# Patient Record
Sex: Female | Born: 1950 | Race: White | Hispanic: No | Marital: Married | Smoking: Former smoker
Health system: Southern US, Community
[De-identification: ages and names within clinical notes are randomized; demographics above are authoritative.]

## PROBLEM LIST (undated history)

## (undated) DIAGNOSIS — E119 Type 2 diabetes mellitus without complications: Secondary | ICD-10-CM

## (undated) DIAGNOSIS — IMO0002 Reserved for concepts with insufficient information to code with codable children: Secondary | ICD-10-CM

## (undated) DIAGNOSIS — D649 Anemia, unspecified: Secondary | ICD-10-CM

## (undated) DIAGNOSIS — M329 Systemic lupus erythematosus, unspecified: Secondary | ICD-10-CM

## (undated) DIAGNOSIS — E786 Lipoprotein deficiency: Secondary | ICD-10-CM

## (undated) DIAGNOSIS — C8590 Non-Hodgkin lymphoma, unspecified, unspecified site: Secondary | ICD-10-CM

## (undated) DIAGNOSIS — T8859XA Other complications of anesthesia, initial encounter: Secondary | ICD-10-CM

## (undated) DIAGNOSIS — Z87891 Personal history of nicotine dependence: Secondary | ICD-10-CM

## (undated) DIAGNOSIS — M3500 Sicca syndrome, unspecified: Secondary | ICD-10-CM

## (undated) DIAGNOSIS — F410 Panic disorder [episodic paroxysmal anxiety] without agoraphobia: Secondary | ICD-10-CM

## (undated) DIAGNOSIS — I4891 Unspecified atrial fibrillation: Secondary | ICD-10-CM

## (undated) DIAGNOSIS — I509 Heart failure, unspecified: Secondary | ICD-10-CM

## (undated) DIAGNOSIS — M797 Fibromyalgia: Secondary | ICD-10-CM

## (undated) DIAGNOSIS — K219 Gastro-esophageal reflux disease without esophagitis: Secondary | ICD-10-CM

## (undated) HISTORY — DX: Systemic lupus erythematosus, unspecified: M32.9

## (undated) HISTORY — DX: Anemia, unspecified: D64.9

## (undated) HISTORY — DX: Personal history of nicotine dependence: Z87.891

## (undated) HISTORY — DX: Non-Hodgkin lymphoma, unspecified, unspecified site: C85.90

## (undated) HISTORY — DX: Unspecified atrial fibrillation: I48.91

## (undated) HISTORY — DX: Type 2 diabetes mellitus without complications: E11.9

## (undated) HISTORY — DX: Reserved for concepts with insufficient information to code with codable children: IMO0002

## (undated) HISTORY — DX: Heart failure, unspecified: I50.9

## (undated) HISTORY — PX: LEG SURGERY: SHX1003

## (undated) HISTORY — DX: Lipoprotein deficiency: E78.6

## (undated) HISTORY — DX: Panic disorder (episodic paroxysmal anxiety): F41.0

## (undated) HISTORY — DX: Sjogren syndrome, unspecified: M35.00

## (undated) HISTORY — PX: CHOLECYSTECTOMY: SHX55

## (undated) HISTORY — DX: Gastro-esophageal reflux disease without esophagitis: K21.9

## (undated) HISTORY — PX: TUBAL LIGATION: SHX77

## (undated) HISTORY — DX: Fibromyalgia: M79.7

---

## 1998-05-10 ENCOUNTER — Ambulatory Visit (HOSPITAL_COMMUNITY): Admission: RE | Admit: 1998-05-10 | Discharge: 1998-05-10 | Payer: Self-pay | Admitting: Cardiology

## 1998-11-03 ENCOUNTER — Other Ambulatory Visit: Admission: RE | Admit: 1998-11-03 | Discharge: 1998-11-03 | Payer: Self-pay

## 2013-02-18 ENCOUNTER — Encounter: Payer: Self-pay | Admitting: Physician Assistant

## 2013-02-19 ENCOUNTER — Encounter: Payer: Self-pay | Admitting: Physician Assistant

## 2013-02-19 ENCOUNTER — Encounter: Payer: Self-pay | Admitting: Cardiology

## 2013-02-19 DIAGNOSIS — R42 Dizziness and giddiness: Secondary | ICD-10-CM

## 2013-02-19 DIAGNOSIS — R079 Chest pain, unspecified: Secondary | ICD-10-CM

## 2013-03-09 ENCOUNTER — Encounter: Payer: Self-pay | Admitting: Cardiology

## 2013-03-11 ENCOUNTER — Ambulatory Visit (INDEPENDENT_AMBULATORY_CARE_PROVIDER_SITE_OTHER): Payer: BC Managed Care – PPO | Admitting: Physician Assistant

## 2013-03-11 ENCOUNTER — Encounter: Payer: Self-pay | Admitting: Physician Assistant

## 2013-03-11 VITALS — BP 116/64 | HR 83 | Ht 65.0 in | Wt 145.0 lb

## 2013-03-11 DIAGNOSIS — R079 Chest pain, unspecified: Secondary | ICD-10-CM | POA: Insufficient documentation

## 2013-03-11 DIAGNOSIS — I509 Heart failure, unspecified: Secondary | ICD-10-CM | POA: Insufficient documentation

## 2013-03-11 DIAGNOSIS — E119 Type 2 diabetes mellitus without complications: Secondary | ICD-10-CM | POA: Insufficient documentation

## 2013-03-11 DIAGNOSIS — D649 Anemia, unspecified: Secondary | ICD-10-CM | POA: Insufficient documentation

## 2013-03-11 DIAGNOSIS — Z87891 Personal history of nicotine dependence: Secondary | ICD-10-CM | POA: Insufficient documentation

## 2013-03-11 DIAGNOSIS — Z136 Encounter for screening for cardiovascular disorders: Secondary | ICD-10-CM

## 2013-03-11 MED ORDER — CARVEDILOL 3.125 MG PO TABS: 3.1250 mg | ORAL_TABLET | Freq: Two times a day (BID) | ORAL | Status: DC

## 2013-03-11 MED ORDER — ISOSORBIDE MONONITRATE ER 30 MG PO TB24: 30.0000 mg | ORAL_TABLET | Freq: Every day | ORAL | Status: DC

## 2013-03-11 NOTE — Assessment & Plan Note (Signed)
Will decrease full dose ASA to 81 mg daily. Further workup deferred to primary M.D. team.

## 2013-03-11 NOTE — Assessment & Plan Note (Signed)
Euvolemic by history and exam

## 2013-03-11 NOTE — Patient Instructions (Addendum)
Your physician recommends that you schedule a follow-up appointment in: 2 weeks with Gene Serpe, PA.  Start:  Coreg 3.125mg  twice daily Start:  Imdur 30mg  daily.

## 2013-03-11 NOTE — Assessment & Plan Note (Signed)
We reviewed the results of the recent adequate GXT Cardiolite, deemed a low risk study, as well as the echocardiogram, which indicated mild LVD with WMAs. Although she has no documented CAD, she presents with multiple CRFs, including DM, and history of tobacco. Recommendation is to initiate aggressive medical therapy with addition of beta blocker and long-acting nitrate, and continue to monitor closely. If she continues to experience chest discomfort, which remains worrisome for ischemic heart disease, then I recommend proceeding with a diagnostic coronary angiogram. Patient is agreeable with this plan. Will arrange early clinic followup with me in approximately 2 weeks.

## 2013-03-11 NOTE — Assessment & Plan Note (Signed)
Quit smoking over a year ago.

## 2013-03-11 NOTE — Progress Notes (Signed)
Primary Cardiologist: Simona Huh, MD (new)   HPI: Post hospital followup from [facility], status post recent presentation with UAP. Troponins NL. Patient presented with multiple CRFs, including DM, but no documented history of CAD. Of note, she carried a remote diagnosis of CHF, reportedly by an oncologist whom she was referred to at that time, but for which she was never hospitalized. We recommended a complete noninvasive evaluation while in hospital, with results as follows:   - GXT Cardiolite, June 5 (93% MPHR): Low risk study with no definite scar/ischemia; EF 48%   - Echocardiogram, June 5: EF 40-45%, grade 1 diastolic dysfunction; apical septal HK; basal anteroseptal/basal anterior HK; mild MR; mild PHTN (41 mmHg)  Clinically, she denies any exertional chest discomfort or neck tightness, the latter which she reportedly has experienced for years when climbing uphill. She had some brief, mild CP while sitting and watching television the other night, for which she did not take NTG.   Twelve-lead EKG today, reviewed by me, indicates NSR 83 bpm; isolated PVCs; no acute changes   No Known Allergies  Current Outpatient Prescriptions  Medication Sig Dispense Refill  . ALPRAZolam (XANAX) 0.5 MG tablet Take 0.5 mg by mouth 4 (four) times daily.       Marland Kitchen aspirin 325 MG EC tablet Take 325 mg by mouth daily.      Marland Kitchen CRANBERRY PO Take 1 tablet by mouth daily.      . digoxin (LANOXIN) 0.125 MG tablet Take 0.125 mg by mouth daily.      Marland Kitchen FLUoxetine (PROZAC) 40 MG capsule Take 40 mg by mouth daily.      . hydrOXYzine (ATARAX/VISTARIL) 25 MG tablet Take 25 mg by mouth 3 (three) times daily as needed for itching.      Marland Kitchen ibuprofen (ADVIL,MOTRIN) 200 MG tablet Take 200 mg by mouth 4 (four) times daily as needed for pain.      Marland Kitchen lansoprazole (PREVACID) 15 MG capsule Take 15 mg by mouth daily.      Marland Kitchen lisinopril (PRINIVIL,ZESTRIL) 2.5 MG tablet Take 2.5 mg by mouth daily.      . metFORMIN (GLUCOPHAGE) 500 MG  tablet Take 1,000-2,000 mg by mouth as needed.      . Multiple Vitamin (MULTIVITAMIN) tablet Take 1 tablet by mouth daily.      . nitroGLYCERIN (NITROSTAT) 0.4 MG SL tablet Place 0.4 mg under the tongue every 5 (five) minutes as needed for chest pain.      . predniSONE (DELTASONE) 5 MG tablet Take 5 mg by mouth daily.      . carvedilol (COREG) 3.125 MG tablet Take 1 tablet (3.125 mg total) by mouth 2 (two) times daily with a meal.  180 tablet  3  . isosorbide mononitrate (IMDUR) 30 MG 24 hr tablet Take 1 tablet (30 mg total) by mouth daily.  90 tablet  3   No current facility-administered medications for this visit.    Past Medical History  Diagnosis Date  . DM (diabetes mellitus)   . Low HDL (under 40)   . GERD (gastroesophageal reflux disease)   . History of tobacco use   . Sjogren's disease   . Fibromyalgia   . Panic disorder   . CHF (congestive heart failure)     Reported remote negative pharmacologic nuclear imaging study and echocardiogram  . Anemia     No past surgical history on file.  History   Social History  . Marital Status: Married    Spouse Name: N/A  Number of Children: N/A  . Years of Education: N/A   Occupational History  . Not on file.   Social History Main Topics  . Smoking status: Former Smoker    Types: Cigarettes    Start date: 09/17/1964    Quit date: 09/18/2011  . Smokeless tobacco: Not on file  . Alcohol Use: Not on file  . Drug Use: Not on file  . Sexually Active: Not on file   Other Topics Concern  . Not on file   Social History Narrative  . No narrative on file   Social History Narrative  . No narrative on file    Problem Relation Age of Onset  . Heart failure Mother   . Heart disease Father     CABG in 70'S    ROS: no nausea, vomiting; no fever, chills; no melena, hematochezia; no claudication  PHYSICAL EXAM: BP 116/64  Pulse 83  Ht 5\' 5"  (1.651 m)  Wt 145 lb (65.772 kg)  BMI 24.13 kg/m2 GENERAL: 62 year old female;  NAD HEENT: NCAT, PERRLA, EOMI; sclera clear; no xanthelasma NECK: palpable bilateral carotid pulses, no bruits; no JVD; no TM LUNGS: CTA bilaterally CARDIAC: RRR (S1, S2); no significant murmurs; no rubs or gallops ABDOMEN: soft, non-tender; intact BS EXTREMETIES: no significant peripheral edema SKIN: warm/dry; no obvious rash/lesions MUSCULOSKELETAL: no joint deformity NEURO: no focal deficit; NL affect   EKG: reviewed and available in Electronic Records   ASSESSMENT & PLAN:  Chest pain We reviewed the results of the recent adequate GXT Cardiolite, deemed a low risk study, as well as the echocardiogram, which indicated mild LVD with WMAs. Although she has no documented CAD, she presents with multiple CRFs, including DM, and history of tobacco. Recommendation is to initiate aggressive medical therapy with addition of beta blocker and long-acting nitrate, and continue to monitor closely. If she continues to experience chest discomfort, which remains worrisome for ischemic heart disease, then I recommend proceeding with a diagnostic coronary angiogram. Patient is agreeable with this plan. Will arrange early clinic followup with me in approximately 2 weeks.  History of tobacco use Quit smoking over a year ago  DM (diabetes mellitus) Followed by primary M.D.  CHF (congestive heart failure) Euvolemic by history and exam  Anemia Will decrease full dose ASA to 81 mg daily. Further workup deferred to primary M.D. team.    Rozell Searing, [facility]

## 2013-03-11 NOTE — Assessment & Plan Note (Signed)
Followed by primary M.D. 

## 2013-03-26 ENCOUNTER — Ambulatory Visit (INDEPENDENT_AMBULATORY_CARE_PROVIDER_SITE_OTHER): Payer: BC Managed Care – PPO | Admitting: Physician Assistant

## 2013-03-26 ENCOUNTER — Encounter: Payer: Self-pay | Admitting: Physician Assistant

## 2013-03-26 VITALS — BP 103/62 | HR 76 | Ht 65.5 in | Wt 145.8 lb

## 2013-03-26 DIAGNOSIS — E785 Hyperlipidemia, unspecified: Secondary | ICD-10-CM | POA: Insufficient documentation

## 2013-03-26 DIAGNOSIS — R079 Chest pain, unspecified: Secondary | ICD-10-CM

## 2013-03-26 DIAGNOSIS — E119 Type 2 diabetes mellitus without complications: Secondary | ICD-10-CM

## 2013-03-26 DIAGNOSIS — D649 Anemia, unspecified: Secondary | ICD-10-CM

## 2013-03-26 MED ORDER — SIMVASTATIN 20 MG PO TABS: 20.0000 mg | ORAL_TABLET | Freq: Every evening | ORAL | Status: DC

## 2013-03-26 NOTE — Assessment & Plan Note (Signed)
Patient remains euvolemic by history and exam. No current indication to add a diuretic to her regimen. I did, however, advise her to not add salt in her diet, and to monitor her weight on a near daily basis. I will also discontinue digoxin, which she was placed on approximately 15 years ago. Preference would be to treat her mild cardiomyopathy with beta blocker and ACE inhibitor therapy. Of note, I am currently unable to up titrate either her carvedilol or her lisinopril dose, secondary to relative hypotension.

## 2013-03-26 NOTE — Assessment & Plan Note (Signed)
Followed by primary M.D. As outlined above, patient to be started on statin therapy today.

## 2013-03-26 NOTE — Assessment & Plan Note (Signed)
Will initiate low intensity statin therapy for plaque stabilization, given that patient has DM. Recent lipid profile notable for LDL 74, but with HDL 23. Will reassess lipid status in 12 weeks.

## 2013-03-26 NOTE — Patient Instructions (Addendum)
Stop Digoxin  Legs elevated  No added salt   Weigh daily  Begin Zocor 20mg  every evening   Labs for FLP, LFT - due in 12 weeks - will send reminder in mail  Continue all other current medications. Follow up in  2 months    Daily Weight Record It is important to weigh yourself daily. Keep this daily weight chart near your scale. Weigh yourself each morning at the same time. Weigh yourself without shoes and with the same amount of clothes each day. Compare today's weight to yesterday's weight. Bring this form with you to your follow-up appointments. Call your caregiver if you gain 3 lb/1.4 kg in 1 day. Call your caregiver if you gain 5 lb/2.3 kg in a week. Date: ________ Weight: ____________________ Date: ________ Weight: ____________________ Date: ________ Weight: ____________________ Date: ________ Weight: ____________________ Date: ________ Weight: ____________________ Date: ________ Weight: ____________________ Date: ________ Weight: ____________________ Date: ________ Weight: ____________________ Date: ________ Weight: ____________________ Date: ________ Weight: ____________________ Date: ________ Weight: ____________________ Date: ________ Weight: ____________________ Date: ________ Weight: ____________________ Date: ________ Weight: ____________________ Date: ________ Weight: ____________________ Date: ________ Weight: ____________________ Date: ________ Weight: ____________________ Date: ________ Weight: ____________________ Date: ________ Weight: ____________________ Date: ________ Weight: ____________________ Date: ________ Weight: ____________________ Date: ________ Weight: ____________________ Date: ________ Weight: ____________________ Date: ________ Weight: ____________________ Date: ________ Weight: ____________________ Date: ________ Weight: ____________________ Date: ________ Weight: ____________________ Date: ________ Weight: ____________________ Date: ________  Weight: ____________________ Date: ________ Weight: ____________________ Date: ________ Weight: ____________________ Date: ________ Weight: ____________________ Date: ________ Weight: ____________________ Date: ________ Weight: ____________________ Date: ________ Weight: ____________________ Date: ________ Weight: ____________________ Date: ________ Weight: ____________________ Date: ________ Weight: ____________________ Date: ________ Weight: ____________________ Date: ________ Weight: ____________________ Date: ________ Weight: ____________________ Date: ________ Weight: ____________________ Date: ________ Weight: ____________________ Date: ________ Weight: ____________________ Date: ________ Weight: ____________________ Date: ________ Weight: ____________________ Date: ________ Weight: ____________________ Date: ________ Weight: ____________________ Date: ________ Weight: ____________________ Date: ________ Weight: ____________________ Document Released: 11/15/2006 Document Revised: 11/26/2011 Document Reviewed: 08/22/2007 [employer] Patient Information 2014 [employer]   Sodium-Controlled Diet Sodium is a mineral. It is found in many foods. Sodium may be found naturally or added during the making of a food. The most common form of sodium is salt, which is made up of sodium and chloride. Reducing your sodium intake involves changing your eating habits. The following guidelines will help you reduce the sodium in your diet:  Stop using the salt shaker.  Use salt sparingly in cooking and baking.  Substitute with sodium-free seasonings and spices.  Do not use a salt substitute (potassium chloride) without your caregiver's permission.  Include a variety of fresh, unprocessed foods in your diet.  Limit the use of processed and convenience foods that are high in sodium. USE THE FOLLOWING FOODS SPARINGLY: Breads/Starches  Commercial bread stuffing, commercial pancake or waffle mixes,  coating mixes. Waffles. Croutons. Prepared (boxed or frozen) potato, rice, or noodle mixes that contain salt or sodium. Salted [place] fries or hash browns. Salted popcorn, breads, crackers, chips, or snack foods. Vegetables  Vegetables canned with salt or prepared in cream, butter, or cheese sauces. Sauerkraut. Tomato or vegetable juices canned with salt.  Fresh vegetables are allowed if rinsed thoroughly. Fruit  Fruit is okay to eat. Meat and Meat Substitutes  Salted or smoked meats, such as bacon or Canadian bacon, chipped or corned beef, hot dogs, salt pork, luncheon meats, pastrami, ham, or sausage. Canned or smoked fish, poultry, or meat. Processed cheese or cheese spreads, blue or [place]ef[place] cheese. Battered  or frozen fish products. Prepared spaghetti sauce. Baked beans. Reuben sandwiches. Salted nuts. Caviar. Milk  Limit buttermilk to 1 cup per week. Soups and Combination Foods  Bouillon cubes, canned or dried soups, broth, consomm. Convenience (frozen or packaged) dinners with more than 600 mg sodium. Pot pies, pizza, Asian food, fast food cheeseburgers, and specialty sandwiches. Desserts and Sweets  Regular (salted) desserts, pie, commercial fruit snack pies, commercial snack cakes, canned puddings.  Eat desserts and sweets in moderation. Fats and Oils  Gravy mixes or canned gravy. No more than 1 to 2 tbs of salad dressing. Chip dips.  Eat fats and oils in moderation. Beverages  See those listed under the vegetables and milk groups. Condiments  Ketchup, mustard, meat sauces, salsa, regular (salted) and lite soy sauce or mustard. Dill pickles, olives, meat tenderizer. Prepared horseradish or pickle relish. Dutch-processed cocoa. Baking powder or baking soda used medicinally. Worcestershire sauce. "Light" salt. Salt substitute, unless approved by your caregiver. Document Released: 02/23/2002 Document Revised: 11/26/2011 Document Reviewed: 09/26/2009 [facility] Patient  Information 2014 [place], [place].

## 2013-03-26 NOTE — Assessment & Plan Note (Signed)
Followed by primary M.D. As previously advised, patient to remain on low-dose ASA 81 mg daily, for primary prevention.

## 2013-03-26 NOTE — Progress Notes (Signed)
Primary Cardiologist: Simona Huh, MD   HPI: Scheduled 2 week followup.  At time of recent post hospital followup, I recommended starting carvedilol 3.125 twice a day and Imdur 30 mg daily, for aggressive medical management of possible ischemic chest pain. Patient had ruled out for MI with NL troponins, and had an adequate GXT Cardiolite which was negative for definite ischemia. She presented with multiple CRFs, including DM, but no documented history of CAD.  She returns today reporting no interim CP, either with exertion or at rest. She is tolerating her current medication regimen. She denies symptoms suggestive of CHF.  No Known Allergies  Current Outpatient Prescriptions  Medication Sig Dispense Refill  . ALPRAZolam (XANAX) 0.5 MG tablet Take 0.5 mg by mouth 4 (four) times daily.       Marland Kitchen aspirin 325 MG EC tablet Take 162.5 mg by mouth daily.       . Calcium Carb-Cholecalciferol (CALCIUM + D3 PO) Take 1 tablet by mouth daily.      . carvedilol (COREG) 3.125 MG tablet Take 1 tablet (3.125 mg total) by mouth 2 (two) times daily with a meal.  180 tablet  3  . CRANBERRY PO Take 1 tablet by mouth daily.      Marland Kitchen FLUoxetine (PROZAC) 40 MG capsule Take 40 mg by mouth daily.      . hydrOXYzine (ATARAX/VISTARIL) 25 MG tablet Take 25 mg by mouth 3 (three) times daily as needed for itching.      Marland Kitchen ibuprofen (ADVIL,MOTRIN) 200 MG tablet Take 200 mg by mouth 4 (four) times daily as needed for pain.      . isosorbide mononitrate (IMDUR) 30 MG 24 hr tablet Take 1 tablet (30 mg total) by mouth daily.  90 tablet  3  . lansoprazole (PREVACID) 15 MG capsule Take 15 mg by mouth daily.      Marland Kitchen lisinopril (PRINIVIL,ZESTRIL) 2.5 MG tablet Take 5 mg by mouth daily.       . Melatonin 3 MG TABS Take 3 mg by mouth at bedtime.      . metFORMIN (GLUCOPHAGE) 500 MG tablet Take 1,000-2,000 mg by mouth as needed.      . Multiple Vitamin (MULTIVITAMIN) tablet Take 1 tablet by mouth daily.      . nitroGLYCERIN (NITROSTAT)  0.4 MG SL tablet Place 0.4 mg under the tongue every 5 (five) minutes as needed for chest pain.      . predniSONE (DELTASONE) 5 MG tablet Take 5 mg by mouth daily.      . simvastatin (ZOCOR) 20 MG tablet Take 1 tablet (20 mg total) by mouth every evening.  30 tablet  6   No current facility-administered medications for this visit.    Past Medical History  Diagnosis Date  . DM (diabetes mellitus)   . Low HDL (under 40)   . GERD (gastroesophageal reflux disease)   . History of tobacco use   . Sjogren's disease   . Fibromyalgia   . Panic disorder   . CHF (congestive heart failure)     Reported remote negative pharmacologic nuclear imaging study and echocardiogram  . Anemia     No past surgical history on file.  History   Social History  . Marital Status: Married    Spouse Name: N/A    Number of Children: N/A  . Years of Education: N/A   Occupational History  . Not on file.   Social History Main Topics  . Smoking status: Former Smoker  Types: Cigarettes    Start date: 09/17/1964    Quit date: 09/18/2011  . Smokeless tobacco: Not on file  . Alcohol Use: Not on file  . Drug Use: Not on file  . Sexually Active: Not on file   Other Topics Concern  . Not on file   Social History Narrative  . No narrative on file    Family History  Problem Relation Age of Onset  . Heart failure Mother   . Heart disease Father     CABG in 70'S    ROS: no nausea, vomiting; no fever, chills; no melena, hematochezia; no claudication  PHYSICAL EXAM: BP 103/62  Pulse 76  Ht 5' 5.5" (1.664 m)  Wt 145 lb 12.8 oz (66.134 kg)  BMI 23.88 kg/m2 GENERAL: 62 year old female; NAD HEENT: NCAT, PERRLA, EOMI; sclera clear; no xanthelasma NECK: palpable bilateral carotid pulses, no bruits; no JVD; no TM LUNGS: CTA bilaterally CARDIAC: RRR (S1, S2); no significant murmurs; no rubs or gallops ABDOMEN: soft, non-tender; intact BS EXTREMETIES:  trace peripheral edema SKIN: warm/dry; no  obvious rash/lesions MUSCULOSKELETAL: no joint deformity NEURO: no focal deficit; NL affect   EKG:    ASSESSMENT & PLAN:  Chest pain No further workup currently indicated. Patient has agreed that we are to maintain a low threshold for diagnostic coronary angiography, if she were to develop any recurrent CP. In retrospect, her initial presenting symptoms appear to have been isolated. Nevertheless, she has multiple CRFs, including DM, and had evidence of WMAs on echocardiography. Will continue current medication regimen, and initiate statin therapy.  HLD (hyperlipidemia) Will initiate low intensity statin therapy for plaque stabilization, given that patient has DM. Recent lipid profile notable for LDL 74, but with HDL 23. Will reassess lipid status in 12 weeks.  CHF (congestive heart failure) Patient remains euvolemic by history and exam. No current indication to add a diuretic to her regimen. I did, however, advise her to not add salt in her diet, and to monitor her weight on a near daily basis. I will also discontinue digoxin, which she was placed on approximately 15 years ago. Preference would be to treat her mild cardiomyopathy with beta blocker and ACE inhibitor therapy. Of note, I am currently unable to up titrate either her carvedilol or her lisinopril dose, secondary to relative hypotension.  DM (diabetes mellitus) Followed by primary M.D. As outlined above, patient to be started on statin therapy today.   Anemia Followed by primary M.D. As previously advised, patient to remain on low-dose ASA 81 mg daily, for primary prevention.    Gene Adja Ruff, PAC

## 2013-03-26 NOTE — Assessment & Plan Note (Signed)
No further workup currently indicated. Patient has agreed that we are to maintain a low threshold for diagnostic coronary angiography, if she were to develop any recurrent CP. In retrospect, her initial presenting symptoms appear to have been isolated. Nevertheless, she has multiple CRFs, including DM, and had evidence of WMAs on echocardiography. Will continue current medication regimen, and initiate statin therapy.

## 2013-04-08 ENCOUNTER — Telehealth: Payer: Self-pay | Admitting: *Deleted

## 2013-04-08 NOTE — Telephone Encounter (Signed)
pt called wanting to know when she is suppose to come in for her Labs. informed pt that notes on file states she will be mailed a reminder in the mail about her labs. pt agreed. Sent staff message Rollen Sox nurse to inform her of the call as well.

## 2013-04-14 ENCOUNTER — Telehealth: Payer: Self-pay | Admitting: *Deleted

## 2013-04-14 NOTE — Telephone Encounter (Signed)
Left message to return call 

## 2013-04-14 NOTE — Telephone Encounter (Signed)
Message copied by Lesle Chris on Tue Apr 14, 2013  2:08 PM ------      Message from: Andrey Cota A      Created: Wed Apr 08, 2013 10:45 AM      Regarding: FYI       Pt called wanting to know when her Labs was suppose to be done. Informed pt that notes states she will be mailed her orders to get labs done. I didn't know if you had a reminder for this or not.  ------

## 2013-04-16 ENCOUNTER — Telehealth: Payer: Self-pay | Admitting: Cardiology

## 2013-04-16 NOTE — Telephone Encounter (Signed)
Informed pt that lab orders will be mailed to her around the end of September first of October

## 2013-04-20 NOTE — Telephone Encounter (Signed)
Burnadette Pop, CMA notified patient last week.

## 2013-05-20 ENCOUNTER — Ambulatory Visit (INDEPENDENT_AMBULATORY_CARE_PROVIDER_SITE_OTHER): Payer: BC Managed Care – PPO | Admitting: Cardiology

## 2013-05-20 ENCOUNTER — Ambulatory Visit: Payer: BC Managed Care – PPO | Admitting: Physician Assistant

## 2013-05-20 ENCOUNTER — Encounter: Payer: Self-pay | Admitting: Cardiology

## 2013-05-20 VITALS — BP 118/59 | HR 89 | Ht 65.5 in | Wt 143.8 lb

## 2013-05-20 DIAGNOSIS — I5022 Chronic systolic (congestive) heart failure: Secondary | ICD-10-CM

## 2013-05-20 DIAGNOSIS — R079 Chest pain, unspecified: Secondary | ICD-10-CM

## 2013-05-20 MED ORDER — CARVEDILOL 6.25 MG PO TABS: 6.2500 mg | ORAL_TABLET | Freq: Two times a day (BID) | ORAL | Status: DC

## 2013-05-20 NOTE — Progress Notes (Signed)
Clinical Summary Ms. Towles is a 62 y.o.female  1. Chest pain:  - prior admission for chest pain with negative troponins, negative exercise stress MPI - seen in f/u 03/26/2013 by PA Prescott Parma w/ no reported repeat episodes of chest pain.  - denies any recent chest pain, describes some DOE w/ grocery shopping, vaccuuming which is stable x 2-3 years - she takes ASA 162 mg daily for primary prevention, discussed previously cutting down to 81 mg but she recently purchased several 325mg  and now is cutting in half. She was started on a statin last visit secondary to her history of DM.   2. Systolic dysfunction: - denies any orthopnea, no PND, occas mild LE edema - compliant w/ medications, limiting sodium intake -checking weights daily, stable around 140 lbs - She takes ibuprofen prn for night time leg pain, denies any claudication like symptoms.    No Known Allergies  Current Outpatient Prescriptions  Medication Sig Dispense Refill  . ALPRAZolam (XANAX) 0.5 MG tablet Take 0.5 mg by mouth 4 (four) times daily.       Marland Kitchen aspirin 325 MG EC tablet Take 162.5 mg by mouth daily.       . Calcium Carb-Cholecalciferol (CALCIUM + D3 PO) Take 1 tablet by mouth daily.      . carvedilol (COREG) 3.125 MG tablet Take 1 tablet (3.125 mg total) by mouth 2 (two) times daily with a meal.  180 tablet  3  . CRANBERRY PO Take 1 tablet by mouth daily.      Marland Kitchen FLUoxetine (PROZAC) 40 MG capsule Take 40 mg by mouth daily.      . hydrOXYzine (ATARAX/VISTARIL) 25 MG tablet Take 25 mg by mouth 3 (three) times daily as needed for itching.      Marland Kitchen ibuprofen (ADVIL,MOTRIN) 200 MG tablet Take 200 mg by mouth 4 (four) times daily as needed for pain.      . isosorbide mononitrate (IMDUR) 30 MG 24 hr tablet Take 1 tablet (30 mg total) by mouth daily.  90 tablet  3  . lansoprazole (PREVACID) 15 MG capsule Take 15 mg by mouth daily.      Marland Kitchen lisinopril (PRINIVIL,ZESTRIL) 2.5 MG tablet Take 5 mg by mouth daily.       .  Melatonin 3 MG TABS Take 3 mg by mouth at bedtime.      . metFORMIN (GLUCOPHAGE) 500 MG tablet Take 1,000-2,000 mg by mouth as needed.      . Multiple Vitamin (MULTIVITAMIN) tablet Take 1 tablet by mouth daily.      . nitroGLYCERIN (NITROSTAT) 0.4 MG SL tablet Place 0.4 mg under the tongue every 5 (five) minutes as needed for chest pain.      . predniSONE (DELTASONE) 5 MG tablet Take 5 mg by mouth daily.      . simvastatin (ZOCOR) 20 MG tablet Take 1 tablet (20 mg total) by mouth every evening.  30 tablet  6   No current facility-administered medications for this visit.    Past Medical History  Diagnosis Date  . DM (diabetes mellitus)   . Low HDL (under 40)   . GERD (gastroesophageal reflux disease)   . History of tobacco use   . Sjogren's disease   . Fibromyalgia   . Panic disorder   . CHF (congestive heart failure)     Reported remote negative pharmacologic nuclear imaging study and echocardiogram  . Anemia     No past surgical history on file.  Family History  Problem Relation Age of Onset  . Heart failure Mother   . Heart disease Father     CABG in 39'S    Social History Ms. Poffenberger reports that she quit smoking about 20 months ago. Her smoking use included Cigarettes. She started smoking about 48 years ago. She smoked 0.00 packs per day. She does not have any smokeless tobacco history on file. Ms. Dixson has no alcohol history on file.  Review of Systems Negative other than HPI   Physical Examination p 89 bp 118/59 Gen: NAD CV: RRR, no m/r/g, no JVD, no carotid bruits, 2+ DP/PT pulses bilaterally Pulm: CTAB Abd: soft, NT, ND, no hepatosplenomegaly Ext: warm, no LE edema Neuro: A&Ox3, no focal deficits Skin: intact, warm, no rash   Diagnostic Studies  02/2013 Exercise MPI: exc 3 min, 7 METs, 93% THR, LVEF 48%, small fixed apical defect due to breast attenuation.   02/2013 Echo: LVEF 40-45%, Grade I diastolic dysfunction, hypokinetic apical septal wall.  Akinetic basal anterolateral and basal anterior walls. Mild MR  EKG 03/11/13: Sinus rhythm, normal axis, no chamber enlargement, no ischemic changes, normal R wave progression.   Assessment/Plan 1. Chest pain: no recurrent episodes since prior admission. Negative MPI, echo w/ mildly reduced LV systolic function w/ described wall motion abnormalities. Continue current CAD risk factor modification and prevention. Consider stopping imdur at next visit, started previously in the setting of suspected angina.    2. Systolic dysfunction/Systolic heart failure: mild LV systolic dysfunction on last echo, WMAs described on echo but negative MPI.Describes NYHA II-III symptoms. Will titrate heart failure medications gradually  to goal doses over next few visits, will increase coreg to 6.25mg  bid today. She has been counseled to monitor for side effects. Also counseled to refrain from taking ibuprofen at home in the setting of systolic dysfunction. F/u in 1 month for further medication titration, once optimized on beta blocker and ACE-I consider adding spironolactone.     Dina Rich MD.

## 2013-05-20 NOTE — Patient Instructions (Addendum)
Your physician recommends that you schedule a follow-up appointment in: 1 month with Dr. Wyline Mood. This appointment will be made today before you leave.  Your physician has recommended you make the following change in your medication:  Increase Carevidol to 6.25 mg twice daily.   Continue all other medications the same.

## 2013-07-01 ENCOUNTER — Ambulatory Visit: Payer: BC Managed Care – PPO | Admitting: Cardiology

## 2013-07-01 ENCOUNTER — Telehealth: Payer: Self-pay | Admitting: *Deleted

## 2013-07-01 ENCOUNTER — Encounter: Payer: Self-pay | Admitting: *Deleted

## 2013-07-01 DIAGNOSIS — Z87891 Personal history of nicotine dependence: Secondary | ICD-10-CM

## 2013-07-01 DIAGNOSIS — Z79899 Other long term (current) drug therapy: Secondary | ICD-10-CM

## 2013-07-01 DIAGNOSIS — E785 Hyperlipidemia, unspecified: Secondary | ICD-10-CM

## 2013-07-01 NOTE — Telephone Encounter (Signed)
Letter & orders mailed today.  

## 2013-07-01 NOTE — Telephone Encounter (Signed)
Message copied by Lesle Chris on Wed Jul 01, 2013 11:08 AM ------      Message from: Lesle Chris      Created: Thu Mar 26, 2013 12:02 PM       FLP, LFT  ------

## 2013-07-23 ENCOUNTER — Telehealth: Payer: Self-pay | Admitting: Cardiology

## 2013-07-23 ENCOUNTER — Other Ambulatory Visit: Payer: Self-pay

## 2013-07-23 NOTE — Telephone Encounter (Signed)
Called pt and left message for pt to return my call.

## 2013-07-24 NOTE — Telephone Encounter (Signed)
Patient called back and informed me that she has not had lab work completed yet.

## 2013-08-21 ENCOUNTER — Telehealth: Payer: Self-pay | Admitting: Cardiology

## 2013-08-21 ENCOUNTER — Encounter: Payer: Self-pay | Admitting: Cardiology

## 2013-08-21 NOTE — Telephone Encounter (Signed)
Called pt and asked if lab work had been completed yet. PT stated that she could not find the lab order and that she had not had them done yet. I informed pt that I would send her a letter with the order. She plans to have lab work done at [facility].

## 2013-08-21 NOTE — Telephone Encounter (Signed)
Message copied by Burnice Logan on Fri Aug 21, 2013  9:26 AM ------      Message from: Lesle Chris      Created: Wed Jul 01, 2013 11:40 AM      Regarding: [facility] FU        Mailed orders for FLP/LFT today.  (per gene OV in July)            Patient is following [facility] now.             Thx.  ------

## 2013-09-09 ENCOUNTER — Other Ambulatory Visit: Payer: Self-pay | Admitting: Physician Assistant

## 2013-09-11 ENCOUNTER — Telehealth: Payer: Self-pay | Admitting: Cardiology

## 2013-09-11 NOTE — Telephone Encounter (Signed)
Message copied by Burnice Logan on Fri Sep 11, 2013 11:47 AM ------      Message from: Dina Rich F      Created: Fri Sep 11, 2013 11:07 AM       Please let patient know that labs look good.            Dina Rich MD ------

## 2013-09-11 NOTE — Telephone Encounter (Signed)
Pt informed of results and verbalized understanding.  

## 2013-09-15 ENCOUNTER — Telehealth: Payer: Self-pay | Admitting: Cardiology

## 2013-09-15 NOTE — Telephone Encounter (Signed)
I will place a recall to send out in a month if she hasn't called to resch/nta

## 2013-09-21 ENCOUNTER — Other Ambulatory Visit: Payer: Self-pay | Admitting: Cardiology

## 2013-09-21 MED ORDER — CARVEDILOL 6.25 MG PO TABS: 6.2500 mg | ORAL_TABLET | Freq: Two times a day (BID) | ORAL | Status: DC

## 2013-10-23 ENCOUNTER — Ambulatory Visit (INDEPENDENT_AMBULATORY_CARE_PROVIDER_SITE_OTHER): Payer: BC Managed Care – PPO | Admitting: Cardiology

## 2013-10-23 VITALS — BP 99/62 | HR 73 | Ht 65.0 in | Wt 150.4 lb

## 2013-10-23 DIAGNOSIS — I5022 Chronic systolic (congestive) heart failure: Secondary | ICD-10-CM

## 2013-10-23 MED ORDER — FUROSEMIDE 20 MG PO TABS
20.0000 mg | ORAL_TABLET | ORAL | Status: DC | PRN
Start: 2013-10-23 — End: 2014-01-21

## 2013-10-23 NOTE — Progress Notes (Signed)
Clinical Summary Kristin Crosby is a 63 y.o.female seen today for follow up of the following medical problems.  1. Chronic Systolic dysfunction:  - LVEF 40-45% by echo 02/2013, she is NYHA II.  - previous negative exercise stress MPI for ischemia  - last visit increased coreg to 6.25mg  bid. Denies any significant side effects.  - notes some orthopnea over the last few weeks. DOE at 3-4 blocks which is stable. Denies any chest pain    Past Medical History  Diagnosis Date  . DM (diabetes mellitus)   . Low HDL (under 40)   . GERD (gastroesophageal reflux disease)   . History of tobacco use   . Sjogren's disease   . Fibromyalgia   . Panic disorder   . CHF (congestive heart failure)     Reported remote negative pharmacologic nuclear imaging study and echocardiogram  . Anemia      No Known Allergies   Current Outpatient Prescriptions  Medication Sig Dispense Refill  . ALPRAZolam (XANAX) 0.5 MG tablet Take 0.5 mg by mouth 4 (four) times daily.       Marland Kitchen aspirin 325 MG EC tablet Take 162.5 mg by mouth daily.       . Calcium Carb-Cholecalciferol (CALCIUM + D3 PO) Take 1 tablet by mouth daily.      . carvedilol (COREG) 6.25 MG tablet Take 1 tablet (6.25 mg total) by mouth 2 (two) times daily with a meal.  60 tablet  6  . CRANBERRY PO Take 1 tablet by mouth daily.      Marland Kitchen FLUoxetine (PROZAC) 40 MG capsule Take 40 mg by mouth daily.      . hydrOXYzine (ATARAX/VISTARIL) 25 MG tablet Take 25 mg by mouth 3 (three) times daily as needed for itching.      Marland Kitchen ibuprofen (ADVIL,MOTRIN) 200 MG tablet Take 200 mg by mouth 4 (four) times daily as needed for pain.      . isosorbide mononitrate (IMDUR) 30 MG 24 hr tablet Take 1 tablet (30 mg total) by mouth daily.  90 tablet  3  . lansoprazole (PREVACID) 15 MG capsule Take 15 mg by mouth daily.      Marland Kitchen lisinopril (PRINIVIL,ZESTRIL) 2.5 MG tablet Take 5 mg by mouth daily.       . metFORMIN (GLUCOPHAGE) 500 MG tablet Take 1,000-2,000 mg by mouth as  needed.      . Multiple Vitamin (MULTIVITAMIN) tablet Take 1 tablet by mouth daily.      . nitroGLYCERIN (NITROSTAT) 0.4 MG SL tablet Place 0.4 mg under the tongue every 5 (five) minutes as needed for chest pain.      . predniSONE (DELTASONE) 5 MG tablet Take 5 mg by mouth daily.      . simvastatin (ZOCOR) 20 MG tablet TAKE ONE TABLET BY MOUTH IN THE EVENING  30 tablet  6   No current facility-administered medications for this visit.     No past surgical history on file.   No Known Allergies    Family History  Problem Relation Age of Onset  . Heart failure Mother   . Heart disease Father     CABG in 60'S     Social History Ms. Golla reports that she quit smoking about 2 years ago. Her smoking use included Cigarettes. She started smoking about 49 years ago. She smoked 0.00 packs per day. She does not have any smokeless tobacco history on file. Ms. Even has no alcohol history on file.  Review of Systems CONSTITUTIONAL: No weight loss, fever, chills, weakness or fatigue.  HEENT: Eyes: No visual loss, blurred vision, double vision or yellow sclerae.No hearing loss, sneezing, congestion, runny nose or sore throat.  SKIN: No rash or itching.  CARDIOVASCULAR: per HPI RESPIRATORY: No shortness of breath, cough or sputum.  GASTROINTESTINAL: No anorexia, nausea, vomiting or diarrhea. No abdominal pain or blood.  GENITOURINARY: No burning on urination, no polyuria NEUROLOGICAL: No headache, dizziness, syncope, paralysis, ataxia, numbness or tingling in the extremities. No change in bowel or bladder control.  MUSCULOSKELETAL: No muscle, back pain, joint pain or stiffness.  LYMPHATICS: No enlarged nodes. No history of splenectomy.  PSYCHIATRIC: No history of depression or anxiety.  ENDOCRINOLOGIC: No reports of sweating, cold or heat intolerance. No polyuria or polydipsia.  Marland Kitchen   Physical Examination p 73 bp 99/62 Wt 150 lbs BMI 25 Gen: resting comfortably, no acute  distress HEENT: no scleral icterus, pupils equal round and reactive, no palptable cervical adenopathy,  CV: RRR, no m/r/g, no JVD, no carotid bruits Resp: Clear to auscultation bilaterally GI: abdomen is soft, non-tender, non-distended, normal bowel sounds, no hepatosplenomegaly MSK: extremities are warm, no edema.  Skin: warm, no rash Neuro:  no focal deficits Psych: appropriate affect   Diagnostic Studies 02/2013 Exercise MPI: exc 3 min, 7 METs, 93% THR, LVEF 48%, small fixed apical defect due to breast attenuation.   02/2013 Echo: LVEF 82-50%, Grade I diastolic dysfunction, hypokinetic apical septal wall. Akinetic basal anterolateral and basal anterior walls. Mild MR   EKG 03/11/13: Sinus rhythm, normal axis, no chamber enlargement, no ischemic changes, normal R wave progression.      Assessment and Plan .   1. Chronic Systolic heart failure: mild LV systolic dysfunction on last echo, WMAs described on echo but negative MPI.Describes NYHA II symptoms.  - will not titrate meds further today due to low normal blood pressure, will stop her imdur and see at next visit if that allows [place] from a bp standpoint to increase her coreg - started prn lasix 20mg  daily as she does describe some occasional orthopnea and LE edema.          Arnoldo Lenis, M.D., F.A.C.C.

## 2013-10-23 NOTE — Patient Instructions (Signed)
Your physician recommends that you schedule a follow-up appointment in: 3 months with Dr. Harl Bowie. This appointment will be scheduled today before you leave.  Your physician has recommended you make the following change in your medication:  Stop: Isosorbide Mononitrate (IMDUR) Start: Furosemide (Lasix) 20 MG take one tablet by mouth once daily as needed for shortness of breath.  Continue all other medications the same.

## 2014-01-21 ENCOUNTER — Encounter: Payer: Self-pay | Admitting: Cardiology

## 2014-01-21 ENCOUNTER — Ambulatory Visit (INDEPENDENT_AMBULATORY_CARE_PROVIDER_SITE_OTHER): Payer: BC Managed Care – PPO | Admitting: Cardiology

## 2014-01-21 VITALS — BP 109/68 | HR 76 | Ht 65.0 in | Wt 153.0 lb

## 2014-01-21 DIAGNOSIS — I5022 Chronic systolic (congestive) heart failure: Secondary | ICD-10-CM

## 2014-01-21 MED ORDER — CARVEDILOL 12.5 MG PO TABS: 12.5000 mg | ORAL_TABLET | Freq: Two times a day (BID) | ORAL | Status: DC

## 2014-01-21 MED ORDER — FUROSEMIDE 40 MG PO TABS
40.0000 mg | ORAL_TABLET | ORAL | Status: DC | PRN
Start: 2014-01-21 — End: 2014-05-14

## 2014-01-21 NOTE — Progress Notes (Signed)
Clinical Summary Ms. Stankovich is a 63 y.o.female seen today for follow up of the following medical problems.  1. Chronic Systolic dysfunction:  - LVEF 40-45% by echo 02/2013, she is NYHA II.  - previous negative exercise stress MPI for ischemia   - weighing herself daily, stable weight is 149 lbs. Feels like lasix in AM is not responsive. Her weight can fluctuate 2-3 pounds, will take lasix at night typically with resolution - limits salt intake, takes ibupforen occasionally.     Past Medical History  Diagnosis Date  . DM (diabetes mellitus)   . Low HDL (under 40)   . GERD (gastroesophageal reflux disease)   . History of tobacco use   . Sjogren's disease   . Fibromyalgia   . Panic disorder   . CHF (congestive heart failure)     Reported remote negative pharmacologic nuclear imaging study and echocardiogram  . Anemia      No Known Allergies   Current Outpatient Prescriptions  Medication Sig Dispense Refill  . ALPRAZolam (XANAX) 0.5 MG tablet Take 0.5 mg by mouth 4 (four) times daily.       Marland Kitchen aspirin 325 MG EC tablet Take 162.5 mg by mouth daily.       . Calcium Carb-Cholecalciferol (CALCIUM + D3 PO) Take 1 tablet by mouth 2 (two) times daily.       . carvedilol (COREG) 6.25 MG tablet Take 1 tablet (6.25 mg total) by mouth 2 (two) times daily with a meal.  60 tablet  6  . CRANBERRY PO Take 1 tablet by mouth daily.      Marland Kitchen FLUoxetine (PROZAC) 20 MG capsule Take 60 mg by mouth daily.      . furosemide (LASIX) 20 MG tablet Take 1 tablet (20 mg total) by mouth as needed (Shortness of breath).  30 tablet  6  . hydrOXYzine (ATARAX/VISTARIL) 25 MG tablet Take 25 mg by mouth 3 (three) times daily as needed for itching.      Marland Kitchen ibuprofen (ADVIL,MOTRIN) 200 MG tablet Take 200 mg by mouth 4 (four) times daily as needed for pain.      Marland Kitchen lansoprazole (PREVACID) 15 MG capsule Take 15 mg by mouth daily.      Marland Kitchen lisinopril (PRINIVIL,ZESTRIL) 5 MG tablet Take 5 mg by mouth daily.      .  metFORMIN (GLUCOPHAGE) 500 MG tablet Take 500 mg by mouth 4 (four) times daily.       . Multiple Vitamin (MULTIVITAMIN) tablet Take 1 tablet by mouth daily.      . nitroGLYCERIN (NITROSTAT) 0.4 MG SL tablet Place 0.4 mg under the tongue every 5 (five) minutes as needed for chest pain.      . predniSONE (DELTASONE) 5 MG tablet Take 5 mg by mouth daily.      . simvastatin (ZOCOR) 20 MG tablet TAKE ONE TABLET BY MOUTH IN THE EVENING  30 tablet  6   No current facility-administered medications for this visit.     No past surgical history on file.   No Known Allergies    Family History  Problem Relation Age of Onset  . Heart failure Mother   . Heart disease Father     CABG in 47'S     Social History Ms. Dieckman reports that she quit smoking about 2 years ago. Her smoking use included Cigarettes. She started smoking about 49 years ago. She smoked 0.00 packs per day. She does not have any smokeless tobacco  history on file. Ms. Coury has no alcohol history on file.   Review of Systems CONSTITUTIONAL: No weight loss, fever, chills, weakness or fatigue.  HEENT: Eyes: No visual loss, blurred vision, double vision or yellow sclerae.No hearing loss, sneezing, congestion, runny nose or sore throat.  SKIN: No rash or itching.  CARDIOVASCULAR: per HPI RESPIRATORY: No shortness of breath, cough or sputum.  GASTROINTESTINAL: No anorexia, nausea, vomiting or diarrhea. No abdominal pain or blood.  GENITOURINARY: No burning on urination, no polyuria NEUROLOGICAL: No headache, dizziness, syncope, paralysis, ataxia, numbness or tingling in the extremities. No change in bowel or bladder control.  MUSCULOSKELETAL: No muscle, back pain, joint pain or stiffness.  LYMPHATICS: No enlarged nodes. No history of splenectomy.  PSYCHIATRIC: No history of depression or anxiety.  ENDOCRINOLOGIC: No reports of sweating, cold or heat intolerance. No polyuria or polydipsia.  Marland Kitchen   Physical Examination p 76  bp 109/68 Wt 153 lbs BMI 25 Gen: resting comfortably, no acute distress HEENT: no scleral icterus, pupils equal round and reactive, no palptable cervical adenopathy,  CV: RRR, no m/r/g, no JVD, no carotid bruits Resp: Clear to auscultation bilaterally GI: abdomen is soft, non-tender, non-distended, normal bowel sounds, no hepatosplenomegaly MSK: extremities are warm, no edema.  Skin: warm, no rash Neuro:  no focal deficits Psych: appropriate affect   Diagnostic Studies 02/2013 Exercise MPI: exc 3 min, 7 METs, 93% THR, LVEF 48%, small fixed apical defect due to breast attenuation.   02/2013 Echo: LVEF 70-17%, Grade I diastolic dysfunction, hypokinetic apical septal wall. Akinetic basal anterolateral and basal anterior walls. Mild MR   EKG 03/11/13: Sinus rhythm, normal axis, no chamber enlargement, no ischemic changes, normal R wave progression.        Assessment and Plan  1. Chronic Systolic heart failure: mild LV systolic dysfunction on last echo, WMAs described on echo but negative MPI.Describes NYHA II symptoms.  - will increase coreg to 12.5mg  bid, counseled to monitor for side effects and to notify us if occur - will increase her prn lasix to 40mg  as needed, 20mg  she reports has limited effectiveness.    F/u 1 month for further medication titration in setting of systolic dysfunction   Arnoldo Lenis, M.D., F.A.C.C.

## 2014-01-21 NOTE — Patient Instructions (Signed)
   Increase Lasix to 40mg  as needed shortness of breath  Increase Coreg to 12.5mg  twice a day    New scripts sent to pharm on above Continue all other medications.    Follow up in  1 month

## 2014-02-24 ENCOUNTER — Encounter: Payer: Self-pay | Admitting: Cardiology

## 2014-02-24 ENCOUNTER — Ambulatory Visit (INDEPENDENT_AMBULATORY_CARE_PROVIDER_SITE_OTHER): Payer: BC Managed Care – PPO | Admitting: Cardiology

## 2014-02-24 VITALS — BP 112/67 | HR 80 | Ht 65.0 in | Wt 155.0 lb

## 2014-02-24 DIAGNOSIS — I509 Heart failure, unspecified: Secondary | ICD-10-CM

## 2014-02-24 DIAGNOSIS — I5022 Chronic systolic (congestive) heart failure: Secondary | ICD-10-CM

## 2014-02-24 NOTE — Patient Instructions (Signed)
Continue all current medications. Your physician wants you to follow up in:  4 months.  You will receive a reminder letter in the mail one-two months in advance.  If you don't receive a letter, please call our office to schedule the follow up appointment   

## 2014-02-24 NOTE — Progress Notes (Signed)
Clinical Summary Kristin Crosby is a 63 y.o.female seen today for follow up of the following medical problems.   1. Chronic Systolic dysfunction:  - LVEF 40-45% by echo 02/2013, she is NYHA II.  - previous negative exercise stress MPI for ischemia  - DOE at 3-4 blocks which is better for her. Occas LE edema, no orthopnea, no PND - weighing herself daily, stable weight is 150-152 lbs. - limits salt intake, takes ibupforen only occasionally.  - compliant with meds, takes lasix prn typically 3-4 times a week - last visit increased coreg to 12.5 mg bid, can have some lightheadedness at times, typically once a week, but overall tolerating medication change.   Past Medical History  Diagnosis Date  . DM (diabetes mellitus)   . Low HDL (under 40)   . GERD (gastroesophageal reflux disease)   . History of tobacco use   . Sjogren's disease   . Fibromyalgia   . Panic disorder   . CHF (congestive heart failure)     Reported remote negative pharmacologic nuclear imaging study and echocardiogram  . Anemia      No Known Allergies   Current Outpatient Prescriptions  Medication Sig Dispense Refill  . ALPRAZolam (XANAX) 0.5 MG tablet Take 0.5 mg by mouth 4 (four) times daily.       Marland Kitchen aspirin 325 MG EC tablet Take 162.5 mg by mouth daily.       . Calcium Carb-Cholecalciferol (CALCIUM + D3 PO) Take 1 tablet by mouth 2 (two) times daily.       . carvedilol (COREG) 12.5 MG tablet Take 1 tablet (12.5 mg total) by mouth 2 (two) times daily with a meal.  60 tablet  6  . CRANBERRY PO Take 1 tablet by mouth daily.      Marland Kitchen FLUoxetine (PROZAC) 20 MG capsule Take 60 mg by mouth daily.      . furosemide (LASIX) 40 MG tablet Take 1 tablet (40 mg total) by mouth as needed (Shortness of breath).  30 tablet  6  . hydrOXYzine (ATARAX/VISTARIL) 25 MG tablet Take 25 mg by mouth 2 (two) times daily.       Marland Kitchen ibuprofen (ADVIL,MOTRIN) 200 MG tablet Take 200 mg by mouth 4 (four) times daily as needed for pain.        Marland Kitchen lansoprazole (PREVACID) 15 MG capsule Take 15 mg by mouth daily.      Marland Kitchen lisinopril (PRINIVIL,ZESTRIL) 5 MG tablet Take 5 mg by mouth daily.      . metFORMIN (GLUCOPHAGE) 500 MG tablet Take 1,000 mg by mouth 2 (two) times daily with a meal.       . Multiple Vitamin (MULTIVITAMIN) tablet Take 1 tablet by mouth daily.      . nitroGLYCERIN (NITROSTAT) 0.4 MG SL tablet Place 0.4 mg under the tongue every 5 (five) minutes as needed for chest pain.      . predniSONE (DELTASONE) 5 MG tablet Take 7.5 mg by mouth daily.       . simvastatin (ZOCOR) 20 MG tablet TAKE ONE TABLET BY MOUTH IN THE EVENING  30 tablet  6   No current facility-administered medications for this visit.     No past surgical history on file.   No Known Allergies    Family History  Problem Relation Age of Onset  . Heart failure Mother   . Heart disease Father     CABG in 59'S     Social History Ms. Foye reports  that she quit smoking about 2 years ago. Her smoking use included Cigarettes. She started smoking about 49 years ago. She smoked 0.00 packs per day. She has never used smokeless tobacco. Ms. Martinek has no alcohol history on file.   Review of Systems CONSTITUTIONAL: No weight loss, fever, chills, weakness or fatigue.  HEENT: Eyes: No visual loss, blurred vision, double vision or yellow sclerae.No hearing loss, sneezing, congestion, runny nose or sore throat.  SKIN: No rash or itching.  CARDIOVASCULAR: per HPI RESPIRATORY: No shortness of breath, cough or sputum.  GASTROINTESTINAL: No anorexia, nausea, vomiting or diarrhea. No abdominal pain or blood.  GENITOURINARY: No burning on urination, no polyuria NEUROLOGICAL: No headache, dizziness, syncope, paralysis, ataxia, numbness or tingling in the extremities. No change in bowel or bladder control.  MUSCULOSKELETAL: No muscle, back pain, joint pain or stiffness.  LYMPHATICS: No enlarged nodes. No history of splenectomy.  PSYCHIATRIC: No history of  depression or anxiety.  ENDOCRINOLOGIC: No reports of sweating, cold or heat intolerance. No polyuria or polydipsia.  Marland Kitchen   Physical Examination p 80 bp 112/67 Wt 155 lbs BMI 26 Gen: resting comfortably, no acute distress HEENT: no scleral icterus, pupils equal round and reactive, no palptable cervical adenopathy,  CV: RRR, no m/r/g, no JVD Resp: Clear to auscultation bilaterally GI: abdomen is soft, non-tender, non-distended, normal bowel sounds, no hepatosplenomegaly MSK: extremities are warm, no edema.  Skin: warm, no rash Neuro:  no focal deficits Psych: appropriate affect   Diagnostic Studies 02/2013 Exercise MPI: exc 3 min, 7 METs, 93% THR, LVEF 48%, small fixed apical defect due to breast attenuation.   02/2013 Echo: LVEF 81-15%, Grade I diastolic dysfunction, hypokinetic apical septal wall. Akinetic basal anterolateral and basal anterior walls. Mild MR      Assessment and Plan  1. Chronic Systolic heart failure: mild LV systolic dysfunction on last echo, WMAs described on echo but negative MPI.Describes NYHA II symptoms. LVEF 40-45% by last echo 02/2013 - describes mild dizziness at times that is tolerable, will avoid further medication titration at this time. Continue current therapy   F/u 4 months       Arnoldo Lenis, M.D., F.A.C.C.

## 2014-05-13 ENCOUNTER — Encounter: Payer: Self-pay | Admitting: Cardiology

## 2014-05-14 ENCOUNTER — Ambulatory Visit (INDEPENDENT_AMBULATORY_CARE_PROVIDER_SITE_OTHER): Payer: BC Managed Care – PPO | Admitting: Cardiology

## 2014-05-14 ENCOUNTER — Encounter: Payer: Self-pay | Admitting: Cardiology

## 2014-05-14 VITALS — BP 106/66 | HR 76 | Ht 65.0 in | Wt 153.1 lb

## 2014-05-14 DIAGNOSIS — I5023 Acute on chronic systolic (congestive) heart failure: Secondary | ICD-10-CM

## 2014-05-14 MED ORDER — POTASSIUM CHLORIDE CRYS ER 20 MEQ PO TBCR: EXTENDED_RELEASE_TABLET | ORAL | Status: DC

## 2014-05-14 MED ORDER — TORSEMIDE 20 MG PO TABS: ORAL_TABLET | ORAL | Status: DC

## 2014-05-14 MED ORDER — SIMVASTATIN 20 MG PO TABS: 20.0000 mg | ORAL_TABLET | Freq: Every day | ORAL | Status: DC

## 2014-05-14 NOTE — Patient Instructions (Addendum)
   Stop Lasix  Change to Torsemide 20mg  twice a day x 2 days only, then daily as needed for swelling  Begin Potassium 80meq daily only on the days you take your Torsemide Continue all other medications.   Meds above & refill for Simvastatin sent to pharm  Follow up in  3 months

## 2014-05-14 NOTE — Telephone Encounter (Signed)
Contacted patient with her concerns and patient was given an appointment to come to the office this morning at 10:40 am.

## 2014-05-14 NOTE — Progress Notes (Signed)
Clinical Summary Ms. Melin is a 63 y.o.female seen today for follow up of the following medical problems.  1. Chronic Systolic dysfunction:  - LVEF 40-45% by echo 02/2013, she is NYHA II.  - previous negative exercise stress MPI for ischemia  - DOE at 3-4 blocks which is better for her. Occas LE edema, no orthopnea, no PND  - weighing herself daily, stable weight is 150-152 lbs.  - limits salt intake, takes ibupforen only occasionally.  - compliant with meds, takes lasix prn typically 3-4 times a week  - last visit increased coreg to 12.5 mg bid, can have some lightheadedness at times, typically once a week, but overall tolerating medication change.  - reports recent weight gain from 150 up to 155, increased orthopnea. Admits to eating salty foods including bacon cheeseburger.      Past Medical History  Diagnosis Date  . DM (diabetes mellitus)   . Low HDL (under 40)   . GERD (gastroesophageal reflux disease)   . History of tobacco use   . Sjogren's disease   . Fibromyalgia   . Panic disorder   . CHF (congestive heart failure)     Reported remote negative pharmacologic nuclear imaging study and echocardiogram  . Anemia      No Known Allergies   Current Outpatient Prescriptions  Medication Sig Dispense Refill  . ALPRAZolam (XANAX) 0.5 MG tablet Take 0.5 mg by mouth 4 (four) times daily.       Marland Kitchen aspirin EC 81 MG tablet Take 81 mg by mouth daily.      . Calcium Carb-Cholecalciferol (CALCIUM + D3 PO) Take 1 tablet by mouth 2 (two) times daily.       . carvedilol (COREG) 12.5 MG tablet Take 1 tablet (12.5 mg total) by mouth 2 (two) times daily with a meal.  60 tablet  6  . CRANBERRY PO Take 1 tablet by mouth daily.      Marland Kitchen FLUoxetine (PROZAC) 20 MG capsule Take 60 mg by mouth daily.      . furosemide (LASIX) 40 MG tablet Take 1 tablet (40 mg total) by mouth as needed (Shortness of breath).  30 tablet  6  . hydrOXYzine (ATARAX/VISTARIL) 25 MG tablet Take 25 mg by mouth 2  (two) times daily.       Marland Kitchen ibuprofen (ADVIL,MOTRIN) 200 MG tablet Take 200 mg by mouth 4 (four) times daily as needed for pain.      Marland Kitchen lansoprazole (PREVACID) 15 MG capsule Take 15 mg by mouth daily.      Marland Kitchen lisinopril (PRINIVIL,ZESTRIL) 5 MG tablet Take 5 mg by mouth daily.      . metFORMIN (GLUCOPHAGE) 500 MG tablet Take 500 mg by mouth 4 (four) times daily.       . Multiple Vitamin (MULTIVITAMIN) tablet Take 1 tablet by mouth daily.      . nitroGLYCERIN (NITROSTAT) 0.4 MG SL tablet Place 0.4 mg under the tongue every 5 (five) minutes as needed for chest pain.      . predniSONE (DELTASONE) 5 MG tablet Take 5 mg by mouth daily.       . simvastatin (ZOCOR) 20 MG tablet TAKE ONE TABLET BY MOUTH IN THE EVENING  30 tablet  6   No current facility-administered medications for this visit.     No past surgical history on file.   No Known Allergies    Family History  Problem Relation Age of Onset  . Heart failure Mother   .  Heart disease Father     CABG in 67'S     Social History Ms. Ask reports that she quit smoking about 2 years ago. Her smoking use included Cigarettes. She started smoking about 49 years ago. She smoked 0.00 packs per day. She has never used smokeless tobacco. Ms. Thurow has no alcohol history on file.   Review of Systems CONSTITUTIONAL: No weight loss, fever, chills, weakness or fatigue.  HEENT: Eyes: No visual loss, blurred vision, double vision or yellow sclerae.No hearing loss, sneezing, congestion, runny nose or sore throat.  SKIN: No rash or itching.  CARDIOVASCULAR: per HPI RESPIRATORY: No shortness of breath, cough or sputum.  GASTROINTESTINAL: No anorexia, nausea, vomiting or diarrhea. No abdominal pain or blood.  GENITOURINARY: No burning on urination, no polyuria NEUROLOGICAL: Occas dizziness.  MUSCULOSKELETAL: No muscle, back pain, joint pain or stiffness.  LYMPHATICS: No enlarged nodes. No history of splenectomy.  PSYCHIATRIC: No history of  depression or anxiety.  ENDOCRINOLOGIC: No reports of sweating, cold or heat intolerance. No polyuria or polydipsia.  Marland Kitchen   Physical Examination p 76 bp 106/66 Wt 153 lbs BMI 25 Gen: resting comfortably, no acute distress HEENT: no scleral icterus, pupils equal round and reactive, no palptable cervical adenopathy,  CV: RRR, no m/r/g, no JVD, no carotid bruits Resp: Clear to auscultation bilaterally GI: abdomen is soft, non-tender, non-distended, normal bowel sounds, no hepatosplenomegaly MSK: extremities are warm, trace bilateral edema Skin: warm, no rash Neuro:  no focal deficits Psych: appropriate affect   Diagnostic Studies 02/2013 Exercise MPI: exc 3 min, 7 METs, 93% THR, LVEF 48%, small fixed apical defect due to breast attenuation.   02/2013 Echo: LVEF 03-40%, Grade I diastolic dysfunction, hypokinetic apical septal wall. Akinetic basal anterolateral and basal anterior walls. Mild MR      Assessment and Plan  1. Acute on chronic Systolic heart failure: mild LV systolic dysfunction on last echo, WMAs described on echo but negative MPI.Describes NYHA II symptoms. LVEF 40-45% by last echo 02/2013  - describes mild dizziness at times that is tolerable, will avoid further medication titration at this time. Continue current therapy - recent dietary indiscretions, increased weight gain and worsening CHF symptoms - change lasix to torsemide, instructed ok to take extra if has weight gains in the future      Arnoldo Lenis MD

## 2014-06-24 ENCOUNTER — Ambulatory Visit: Payer: BC Managed Care – PPO | Admitting: Cardiology

## 2014-07-14 ENCOUNTER — Encounter: Payer: Self-pay | Admitting: Cardiology

## 2014-08-02 ENCOUNTER — Other Ambulatory Visit: Payer: Self-pay | Admitting: Cardiology

## 2014-08-02 MED ORDER — CARVEDILOL 12.5 MG PO TABS: 12.5000 mg | ORAL_TABLET | Freq: Two times a day (BID) | ORAL | Status: DC

## 2014-08-02 NOTE — Telephone Encounter (Signed)
Received fax refill request  Rx # [id number] Medication:  Carvedilol 12.5 mg tablet Qty 60 Sig:  Take one tablet by mouth twice a day with meals Physician:  Harl Bowie

## 2014-08-02 NOTE — Telephone Encounter (Signed)
Done

## 2014-08-16 ENCOUNTER — Ambulatory Visit (INDEPENDENT_AMBULATORY_CARE_PROVIDER_SITE_OTHER): Payer: BC Managed Care – PPO | Admitting: Cardiology

## 2014-08-16 ENCOUNTER — Encounter: Payer: Self-pay | Admitting: Cardiology

## 2014-08-16 VITALS — BP 97/59 | HR 74 | Ht 64.5 in | Wt 150.0 lb

## 2014-08-16 DIAGNOSIS — I5023 Acute on chronic systolic (congestive) heart failure: Secondary | ICD-10-CM

## 2014-08-16 NOTE — Patient Instructions (Signed)
Continue all current medications. Your physician wants you to follow up in: 6 months.  You will receive a reminder letter in the mail one-two months in advance.  If you don't receive a letter, please call our office to schedule the follow up appointment   

## 2014-08-16 NOTE — Progress Notes (Signed)
Clinical Summary Kristin Crosby is a 63 y.o.female seen today for follow up of the following medical problems.    1. Chronic Systolic dysfunction:  - LVEF 40-45% by echo 02/2013, she is NYHA II.  - previous negative exercise stress MPI for ischemia  - no significant SOB, DOE, or LE edema  - weighing herself daily, stable weight is 150-152 lbs.  - compliant with meds, titration has been limited due to soft bp's and some orthostatic dizziness. Taking torsemide only prn as needed, effective at controlling her LE edema.      Past Medical History  Diagnosis Date  . DM (diabetes mellitus)   . Low HDL (under 40)   . GERD (gastroesophageal reflux disease)   . History of tobacco use   . Sjogren's disease   . Fibromyalgia   . Panic disorder   . CHF (congestive heart failure)     Reported remote negative pharmacologic nuclear imaging study and echocardiogram  . Anemia      No Known Allergies   Current Outpatient Prescriptions  Medication Sig Dispense Refill  . ALPRAZolam (XANAX) 0.5 MG tablet Take 0.5 mg by mouth 4 (four) times daily.     Marland Kitchen aspirin EC 81 MG tablet Take 81 mg by mouth daily.    . Calcium Carb-Cholecalciferol (CALCIUM + D3 PO) Take 1 tablet by mouth 2 (two) times daily.     . carvedilol (COREG) 12.5 MG tablet Take 1 tablet (12.5 mg total) by mouth 2 (two) times daily with a meal. 60 tablet 6  . CRANBERRY PO Take 1 tablet by mouth daily.    Marland Kitchen FLUoxetine (PROZAC) 20 MG capsule Take 60 mg by mouth daily.    . hydrOXYzine (ATARAX/VISTARIL) 25 MG tablet Take 25 mg by mouth as needed.     Marland Kitchen ibuprofen (ADVIL,MOTRIN) 200 MG tablet Take 200 mg by mouth 4 (four) times daily as needed for pain.    Marland Kitchen lansoprazole (PREVACID) 15 MG capsule Take 15 mg by mouth daily.    Marland Kitchen lisinopril (PRINIVIL,ZESTRIL) 5 MG tablet Take 5 mg by mouth daily.    . metFORMIN (GLUCOPHAGE) 500 MG tablet Take 500 mg by mouth 4 (four) times daily.     . Multiple Vitamin (MULTIVITAMIN) tablet Take 1  tablet by mouth daily.    . nitroGLYCERIN (NITROSTAT) 0.4 MG SL tablet Place 0.4 mg under the tongue every 5 (five) minutes as needed for chest pain.    . potassium chloride SA (K-DUR,KLOR-CON) 20 MEQ tablet Take one tab by mouth as needed only on the days you take the Torsemide 30 tablet 3  . predniSONE (DELTASONE) 5 MG tablet Take 5 mg by mouth daily.     . simvastatin (ZOCOR) 20 MG tablet Take 1 tablet (20 mg total) by mouth daily. 30 tablet 6  . torsemide (DEMADEX) 20 MG tablet Take one tablet by mouth twice a day x 2 days only beginning today (05/14/2014), then daily as needed for swelling 30 tablet 3   No current facility-administered medications for this visit.     No past surgical history on file.   No Known Allergies    Family History  Problem Relation Age of Onset  . Heart failure Mother   . Heart disease Father     CABG in 3'S     Social History Ms. Kettlewell reports that she quit smoking about 2 years ago. Her smoking use included Cigarettes. She started smoking about 49 years ago. She smoked 0.00  packs per day. She has never used smokeless tobacco. Ms. Munos has no alcohol history on file.   Review of Systems CONSTITUTIONAL: No weight loss, fever, chills, weakness or fatigue.  HEENT: Eyes: No visual loss, blurred vision, double vision or yellow sclerae.No hearing loss, sneezing, congestion, runny nose or sore throat.  SKIN: No rash or itching.  CARDIOVASCULAR: per HPI RESPIRATORY: No shortness of breath, cough or sputum.  GASTROINTESTINAL: No anorexia, nausea, vomiting or diarrhea. No abdominal pain or blood.  GENITOURINARY: No burning on urination, no polyuria NEUROLOGICAL: No headache, dizziness, syncope, paralysis, ataxia, numbness or tingling in the extremities. No change in bowel or bladder control.  MUSCULOSKELETAL: No muscle, back pain, joint pain or stiffness.  LYMPHATICS: No enlarged nodes. No history of splenectomy.  PSYCHIATRIC: No history of  depression or anxiety.  ENDOCRINOLOGIC: No reports of sweating, cold or heat intolerance. No polyuria or polydipsia.  Marland Kitchen   Physical Examination p 74 bp 97/59 Wt 150 lbs BMI 25 Gen: resting comfortably, no acute distress HEENT: no scleral icterus, pupils equal round and reactive, no palptable cervical adenopathy,  CV: RRR, no m/r/g, no JVD, no carotid bruits Resp: Clear to auscultation bilaterally GI: abdomen is soft, non-tender, non-distended, normal bowel sounds, no hepatosplenomegaly MSK: extremities are warm, no edema.  Skin: warm, no rash Neuro:  no focal deficits Psych: appropriate affect   Diagnostic Studies  02/2013 Exercise MPI: exc 3 min, 7 METs, 93% THR, LVEF 48%, small fixed apical defect due to breast attenuation.   02/2013 Echo: LVEF 03-40%, Grade I diastolic dysfunction, hypokinetic apical septal wall. Akinetic basal anterolateral and basal anterior walls. Mild MR    Assessment and Plan   1. Acute on chronic Systolic heart failure: mild LV systolic dysfunction on last echo, WMAs described on echo but negative MPI.Describes NYHA II symptoms. LVEF 40-45% by last echo 02/2013  - no current symptoms, appears euvolemic, stable weights at home - medication titration limited due to soft bp and some orthostatic dizziness, symptoms currently tolerable, continue current doses.    F/u 6 months   Arnoldo Lenis, M.D.

## 2014-08-23 ENCOUNTER — Other Ambulatory Visit: Payer: Self-pay | Admitting: Cardiology

## 2014-08-23 MED ORDER — CARVEDILOL 12.5 MG PO TABS: 12.5000 mg | ORAL_TABLET | Freq: Two times a day (BID) | ORAL | Status: DC

## 2014-08-23 NOTE — Telephone Encounter (Signed)
Refill complete 

## 2014-08-23 NOTE — Telephone Encounter (Signed)
Received fax refill request  Rx # [id number] Medication:  Carvedilol 12.5 mg tablet Qty 60 Sig:  Take one tablet by mouth twice a day with meals Physician:  Harl Bowie

## 2014-09-03 ENCOUNTER — Other Ambulatory Visit: Payer: Self-pay | Admitting: *Deleted

## 2014-09-03 MED ORDER — CARVEDILOL 12.5 MG PO TABS: 12.5000 mg | ORAL_TABLET | Freq: Two times a day (BID) | ORAL | Status: DC

## 2014-12-20 ENCOUNTER — Telehealth: Payer: Self-pay | Admitting: *Deleted

## 2014-12-20 MED ORDER — SIMVASTATIN 20 MG PO TABS: 20.0000 mg | ORAL_TABLET | Freq: Every day | ORAL | Status: DC

## 2014-12-20 NOTE — Telephone Encounter (Signed)
Refill request for simvastatin 20 mg from [facility]. Medication sent to pharmacy.

## 2014-12-24 ENCOUNTER — Other Ambulatory Visit: Payer: Self-pay | Admitting: *Deleted

## 2014-12-24 MED ORDER — SIMVASTATIN 20 MG PO TABS: 20.0000 mg | ORAL_TABLET | Freq: Every day | ORAL | Status: DC

## 2014-12-24 NOTE — Telephone Encounter (Signed)
Simvastatin refill sent to [facility].

## 2015-06-21 ENCOUNTER — Other Ambulatory Visit: Payer: Self-pay | Admitting: *Deleted

## 2015-06-21 MED ORDER — LISINOPRIL 5 MG PO TABS: 5.0000 mg | ORAL_TABLET | Freq: Every day | ORAL | Status: DC

## 2015-07-02 ENCOUNTER — Other Ambulatory Visit: Payer: Self-pay | Admitting: Cardiology

## 2015-07-18 ENCOUNTER — Other Ambulatory Visit: Payer: Self-pay | Admitting: *Deleted

## 2015-07-18 MED ORDER — SIMVASTATIN 20 MG PO TABS: 20.0000 mg | ORAL_TABLET | Freq: Every day | ORAL | Status: DC

## 2015-07-25 ENCOUNTER — Other Ambulatory Visit: Payer: Self-pay

## 2015-07-25 MED ORDER — SIMVASTATIN 20 MG PO TABS: 20.0000 mg | ORAL_TABLET | Freq: Every day | ORAL | Status: DC

## 2015-07-25 NOTE — Telephone Encounter (Signed)
Request refill for Simvastatin 20 mg.  Pt has an appointment coming up 11-16 I gave refill for 30 day with 0 refills.

## 2015-08-03 ENCOUNTER — Ambulatory Visit (INDEPENDENT_AMBULATORY_CARE_PROVIDER_SITE_OTHER): Payer: PPO | Admitting: Cardiology

## 2015-08-03 ENCOUNTER — Encounter: Payer: Self-pay | Admitting: Cardiology

## 2015-08-03 ENCOUNTER — Encounter: Payer: Self-pay | Admitting: *Deleted

## 2015-08-03 VITALS — BP 108/67 | HR 86 | Ht 64.5 in | Wt 158.0 lb

## 2015-08-03 DIAGNOSIS — I5022 Chronic systolic (congestive) heart failure: Secondary | ICD-10-CM | POA: Diagnosis not present

## 2015-08-03 DIAGNOSIS — I5023 Acute on chronic systolic (congestive) heart failure: Secondary | ICD-10-CM | POA: Diagnosis not present

## 2015-08-03 MED ORDER — ASPIRIN 81 MG PO TABS: 81.0000 mg | ORAL_TABLET | Freq: Every day | ORAL | Status: DC

## 2015-08-03 MED ORDER — CARVEDILOL 12.5 MG PO TABS: 12.5000 mg | ORAL_TABLET | Freq: Two times a day (BID) | ORAL | Status: DC

## 2015-08-03 MED ORDER — TORSEMIDE 20 MG PO TABS: 20.0000 mg | ORAL_TABLET | ORAL | Status: DC

## 2015-08-03 MED ORDER — SIMVASTATIN 20 MG PO TABS: 20.0000 mg | ORAL_TABLET | Freq: Every day | ORAL | Status: DC

## 2015-08-03 MED ORDER — LISINOPRIL 5 MG PO TABS: 5.0000 mg | ORAL_TABLET | Freq: Every day | ORAL | Status: DC

## 2015-08-03 NOTE — Patient Instructions (Signed)
   Decrease Aspirin to 81mg  daily   Change your Torsemide to 20mg  every other day   Above medication & refills for Coreg, Lisinopril, & Simvastatin sent to [facility] today. Continue all other medications.   Labs for BMET, Magnesium - due in 2 weeks Office will contact with results via phone or letter.   Follow up in  6 weeks

## 2015-08-03 NOTE — Progress Notes (Signed)
Patient ID: Kristin Crosby, female   DOB: [birth year], 64 y.o.   MRN: [id number]     Clinical Summary [name] is a 64 y.o.female seen today for follow up of the following medical problems.   1. Chronic Systolic heart failure - LVEF 40-45% by echo 02/2013, she is NYHA II.  - previous negative exercise stress MPI for ischemia  -titration has been limited due to soft bp's and some orthostatic dizziness. Taking torsemide only prn as needed, effective at controlling her LE edema. - notes some occasional SOB. weight is up from 150 to 158 over the last year.  - notes some occasional LE edema and abdominal distension. Can have some orthopena - working to limit salt intake.    Past Medical History  Diagnosis Date  . DM (diabetes mellitus)   . Low HDL (under 40)   . GERD (gastroesophageal reflux disease)   . History of tobacco use   . Sjogren's disease   . Fibromyalgia   . Panic disorder   . CHF (congestive heart failure)     Reported remote negative pharmacologic nuclear imaging study and echocardiogram  . Anemia      Allergies  Allergen Reactions  . Codeine Camsylate [Codeine] Itching     Current Outpatient Prescriptions  Medication Sig Dispense Refill  . ALPRAZolam (XANAX) 0.5 MG tablet Take 0.5 mg by mouth 4 (four) times daily.     Marland Kitchen aspirin EC 81 MG tablet Take 81 mg by mouth daily.    . Calcium Carb-Cholecalciferol (CALCIUM + D3 PO) Take 1 tablet by mouth 2 (two) times daily.     . carvedilol (COREG) 12.5 MG tablet Take 1 tablet (12.5 mg total) by mouth 2 (two) times daily with a meal. 60 tablet 6  . CRANBERRY PO Take 1 tablet by mouth daily.    . ferrous sulfate (KP FERROUS SULFATE) 325 (65 FE) MG tablet Take 325 mg by mouth daily with breakfast.    . FLUoxetine (PROZAC) 20 MG capsule Take 60 mg by mouth daily.    . hydrOXYzine (ATARAX/VISTARIL) 25 MG tablet Take 25 mg by mouth as needed.     Marland Kitchen ibuprofen (ADVIL,MOTRIN) 200 MG tablet Take 200 mg by mouth 4 (four)  times daily as needed for pain.    Marland Kitchen lansoprazole (PREVACID) 30 MG capsule Take 30 mg by mouth daily at 12 noon.    Marland Kitchen lisinopril (PRINIVIL,ZESTRIL) 5 MG tablet TAKE ONE TABLET BY MOUTH ONCE DAILY **NEED OFFICE VISIT** 30 tablet 0  . metFORMIN (GLUCOPHAGE) 500 MG tablet Take 500 mg by mouth 4 (four) times daily.     . Multiple Vitamin (MULTIVITAMIN) tablet Take 1 tablet by mouth daily.    . nitroGLYCERIN (NITROSTAT) 0.4 MG SL tablet Place 0.4 mg under the tongue every 5 (five) minutes as needed for chest pain.    . potassium chloride SA (K-DUR,KLOR-CON) 20 MEQ tablet Take one tab by mouth as needed only on the days you take the Torsemide 30 tablet 3  . predniSONE (DELTASONE) 5 MG tablet Take 5 mg by mouth daily.     . simvastatin (ZOCOR) 20 MG tablet Take 1 tablet (20 mg total) by mouth daily. 30 tablet 0  . torsemide (DEMADEX) 20 MG tablet Take one tablet by mouth twice a day x 2 days only beginning today (05/14/2014), then daily as needed for swelling 30 tablet 3   No current facility-administered medications for this visit.     No past surgical history on file.  Allergies  Allergen Reactions  . Codeine Camsylate [Codeine] Itching      Family History  Problem Relation Age of Onset  . Heart failure Mother   . Heart disease Father     CABG in 43'S     Social History [name] reports that she quit smoking about 3 years ago. Her smoking use included Cigarettes. She started smoking about 50 years ago. She has never used smokeless tobacco. [name] has no alcohol history on file.   Review of Systems CONSTITUTIONAL: No weight loss, fever, chills, weakness or fatigue.  HEENT: Eyes: No visual loss, blurred vision, double vision or yellow sclerae.No hearing loss, sneezing, congestion, runny nose or sore throat.  SKIN: No rash or itching.  CARDIOVASCULAR: per HPI RESPIRATORY: No shortness of breath, cough or sputum.  GASTROINTESTINAL: No anorexia, nausea, vomiting or  diarrhea. No abdominal pain or blood.  GENITOURINARY: No burning on urination, no polyuria NEUROLOGICAL: No headache, dizziness, syncope, paralysis, ataxia, numbness or tingling in the extremities. No change in bowel or bladder control.  MUSCULOSKELETAL: No muscle, back pain, joint pain or stiffness.  LYMPHATICS: No enlarged nodes. No history of splenectomy.  PSYCHIATRIC: No history of depression or anxiety.  ENDOCRINOLOGIC: No reports of sweating, cold or heat intolerance. No polyuria or polydipsia.  Marland Kitchen   Physical Examination Filed Vitals:   08/03/15 1055  BP: 108/67  Pulse: 86   Filed Vitals:   08/03/15 1055  Height: 5' 4.5" (1.638 m)  Weight: 158 lb (71.668 kg)    Gen: resting comfortably, no acute distress HEENT: no scleral icterus, pupils equal round and reactive, no palptable cervical adenopathy,  CV: RRR, no m/r/g, no jvd Resp: Clear to auscultation bilaterally GI: abdomen is soft, non-tender, non-distended, normal bowel sounds, no hepatosplenomegaly MSK: extremities are warm, trace bilateral edema.  Skin: warm, no rash Neuro:  no focal deficits Psych: appropriate affect   Diagnostic Studies 02/2013 Exercise MPI: exc 3 min, 7 METs, 93% THR, LVEF 48%, small fixed apical defect due to breast attenuation.   02/2013 Echo: LVEF [id number], Grade I diastolic dysfunction, hypokinetic apical septal wall. Akinetic basal anterolateral and basal anterior walls. Mild MR      Assessment and Plan  1. Acute on chronic Systolic heart failure: mild LV systolic dysfunction on last echo, WMAs described on echo but negative MPI.Describes NYHA II symptoms. LVEF 40-45% by last echo 02/2013  - medication titration limited due to soft bp and some orthostatic dizziness - some evidence of fluid overload. she has not been taking her torsemide regularly due to fatigue. Will try dosing it every other day and see how tolerated, repeat BMET and Mg in 2 weeks.   F/u 6 weeks      Arnoldo Lenis, M.D

## 2015-08-25 ENCOUNTER — Telehealth: Payer: Self-pay | Admitting: *Deleted

## 2015-08-25 NOTE — Telephone Encounter (Signed)
-----   Message from Arnoldo Lenis, MD sent at 08/24/2015  3:51 PM EST ----- Labs look good  Zandra Abts MD

## 2015-08-25 NOTE — Telephone Encounter (Signed)
Pt aware, routed to pcp 

## 2015-09-20 ENCOUNTER — Encounter: Payer: Self-pay | Admitting: Cardiology

## 2015-09-20 ENCOUNTER — Ambulatory Visit (INDEPENDENT_AMBULATORY_CARE_PROVIDER_SITE_OTHER): Payer: PPO | Admitting: Cardiology

## 2015-09-20 VITALS — BP 105/58 | HR 73 | Ht 64.5 in | Wt 152.0 lb

## 2015-09-20 DIAGNOSIS — I5022 Chronic systolic (congestive) heart failure: Secondary | ICD-10-CM

## 2015-09-20 DIAGNOSIS — D509 Iron deficiency anemia, unspecified: Secondary | ICD-10-CM | POA: Diagnosis not present

## 2015-09-20 DIAGNOSIS — E119 Type 2 diabetes mellitus without complications: Secondary | ICD-10-CM | POA: Diagnosis not present

## 2015-09-20 DIAGNOSIS — R0602 Shortness of breath: Secondary | ICD-10-CM

## 2015-09-20 NOTE — Patient Instructions (Signed)
Your physician wants you to follow-up in: [place] [address] BRANCH You will receive a reminder letter in the mail two months in advance. If you don't receive a letter, please call our office to schedule the follow-up appointment.  Your physician recommends that you continue on your current medications as directed. Please refer to the Current Medication list given to you today.  Your physician has recommended that you have a pulmonary function test. Pulmonary Function Tests are a group of tests that measure how well air moves in and out of your lungs.  WE HAVE GIVEN YOU ORDERS TO HAVE TESTING DONE AT [facility]   Thank you for choosing [facility]!!

## 2015-09-20 NOTE — Progress Notes (Signed)
Patient ID: Kristin Crosby, female   DOB: Aug 03, 1951, 65 y.o.   MRN: [id number]     Clinical Summary Kristin Crosby is a 65 y.o.female seen today for follow up of the following medical problems  1. Chronic Systolic heart failure - LVEF 40-45% by echo 02/2013, she is NYHA II.  - 02/2013 negative exercise stress MPI for ischemia  -medication titration has been limited due to soft bp's and some orthostatic dizziness. Taking torsemide only prn as needed, effective at controlling her LE edema.  - tough time getting fluid down due to dysphagia but has been taking. Weight has trended down since last visit.   - SOB improved since last visit.   2. Tobacco - former smoker x 30 years. Has intermittent episodes of SOB.  - she has never had PFTs    Past Medical History  Diagnosis Date  . DM (diabetes mellitus) ([place])   . Low HDL (under 40)   . GERD (gastroesophageal reflux disease)   . History of tobacco use   . Sjogren's disease ([place])   . Fibromyalgia   . Panic disorder   . CHF (congestive heart failure) ([place])     Reported remote negative pharmacologic nuclear imaging study and echocardiogram  . Anemia      Allergies  Allergen Reactions  . Codeine Camsylate [Codeine] Itching     Current Outpatient Prescriptions  Medication Sig Dispense Refill  . ALPRAZolam (XANAX) 0.5 MG tablet Take 0.5 mg by mouth 4 (four) times daily as needed.     Marland Kitchen aspirin 81 MG tablet Take 1 tablet (81 mg total) by mouth daily.    . Calcium Carb-Cholecalciferol (CALCIUM + D3 PO) Take 1 tablet by mouth 2 (two) times daily.     . carvedilol (COREG) 12.5 MG tablet Take 1 tablet (12.5 mg total) by mouth 2 (two) times daily with a meal. 60 tablet 6  . CRANBERRY PO Take 1 tablet by mouth daily.    Marland Kitchen FLUoxetine (PROZAC) 20 MG capsule Take 60 mg by mouth daily.    . hydrOXYzine (ATARAX/VISTARIL) 25 MG tablet Take 25 mg by mouth 2 (two) times daily.     Marland Kitchen ibuprofen (ADVIL,MOTRIN) 200 MG tablet Take 200 mg by mouth 4  (four) times daily as needed for pain.    Marland Kitchen lisinopril (PRINIVIL,ZESTRIL) 5 MG tablet Take 1 tablet (5 mg total) by mouth daily. 30 tablet 6  . meclizine (ANTIVERT) 25 MG tablet Take 25 mg by mouth 3 (three) times daily as needed for dizziness (vertigo).    . metFORMIN (GLUCOPHAGE) 500 MG tablet Take 1,000 mg by mouth 2 (two) times daily with a meal.    . Multiple Vitamin (MULTIVITAMIN) tablet Take 1 tablet by mouth daily.    . nitroGLYCERIN (NITROSTAT) 0.4 MG SL tablet Place 0.4 mg under the tongue every 5 (five) minutes as needed for chest pain.    . pantoprazole (PROTONIX) 40 MG tablet Take 40 mg by mouth daily.    . potassium chloride SA (K-DUR,KLOR-CON) 20 MEQ tablet Take one tab by mouth as needed only on the days you take the Torsemide 30 tablet 3  . predniSONE (DELTASONE) 5 MG tablet Take 5 mg by mouth daily.     . simvastatin (ZOCOR) 20 MG tablet Take 1 tablet (20 mg total) by mouth daily. 30 tablet 6  . torsemide (DEMADEX) 20 MG tablet Take 1 tablet (20 mg total) by mouth every other day. 30 tablet 6   No current facility-administered medications  for this visit.     No past surgical history on file.   Allergies  Allergen Reactions  . Codeine Camsylate [Codeine] Itching      Family History  Problem Relation Age of Onset  . Heart failure Mother   . Heart disease Father     CABG in 39'S     Social History Kristin Crosby reports that she quit smoking about 4 years ago. Her smoking use included Cigarettes. She started smoking about 51 years ago. She has never used smokeless tobacco. Kristin Crosby has no alcohol history on file.   Review of Systems CONSTITUTIONAL: No weight loss, fever, chills, weakness or fatigue.  HEENT: Eyes: No visual loss, blurred vision, double vision or yellow sclerae.No hearing loss, sneezing, congestion, runny nose or sore throat.  SKIN: No rash or itching.  CARDIOVASCULAR: per HPI RESPIRATORY:per HPI GASTROINTESTINAL: No anorexia, nausea,  vomiting or diarrhea. No abdominal pain or blood.  GENITOURINARY: No burning on urination, no polyuria NEUROLOGICAL: No headache, dizziness, syncope, paralysis, ataxia, numbness or tingling in the extremities. No change in bowel or bladder control.  MUSCULOSKELETAL: left hip pain LYMPHATICS: No enlarged nodes. No history of splenectomy.  PSYCHIATRIC: No history of depression or anxiety.  ENDOCRINOLOGIC: No reports of sweating, cold or heat intolerance. No polyuria or polydipsia.  Marland Kitchen   Physical Examination Filed Vitals:   09/20/15 1023  BP: 105/58  Pulse: 73   Filed Vitals:   09/20/15 1023  Height: 5' 4.5" (1.638 m)  Weight: 152 lb (68.947 kg)    Gen: resting comfortably, no acute distress HEENT: no scleral icterus, pupils equal round and reactive, no palptable cervical adenopathy,  CV: RRR, no m/r/g, no jvd Resp: Clear to auscultation bilaterally GI: abdomen is soft, non-tender, non-distended, normal bowel sounds, no hepatosplenomegaly MSK: extremities are warm, no edema.  Skin: warm, no rash Neuro:  no focal deficits Psych: appropriate affect   Diagnostic Studies 02/2013 Exercise MPI: exc 3 min, 7 METs, 93% THR, LVEF 48%, small fixed apical defect due to breast attenuation.   02/2013 Echo: LVEF A999333, Grade I diastolic dysfunction, hypokinetic apical septal wall. Akinetic basal anterolateral and basal anterior walls. Mild MR      Assessment and Plan  1. Chronic systolic heart failure - SOB and weight improved since she is takign her torsemide more regularly, continue current meds  2. Tobacco abuse - former smoker. Episodic SOB that is not clearly always related to her CHF. Will obtain PFTs   F/u 6 months      Arnoldo Lenis, M.D.

## 2015-09-26 DIAGNOSIS — R0602 Shortness of breath: Secondary | ICD-10-CM | POA: Diagnosis not present

## 2015-09-26 LAB — PULMONARY FUNCTION TEST

## 2015-09-27 ENCOUNTER — Telehealth: Payer: Self-pay | Admitting: *Deleted

## 2015-09-27 DIAGNOSIS — M160 Bilateral primary osteoarthritis of hip: Secondary | ICD-10-CM | POA: Diagnosis not present

## 2015-09-27 DIAGNOSIS — M545 Low back pain: Secondary | ICD-10-CM | POA: Diagnosis not present

## 2015-09-27 DIAGNOSIS — E119 Type 2 diabetes mellitus without complications: Secondary | ICD-10-CM | POA: Diagnosis not present

## 2015-09-27 DIAGNOSIS — F411 Generalized anxiety disorder: Secondary | ICD-10-CM | POA: Diagnosis not present

## 2015-09-27 DIAGNOSIS — M3501 Sicca syndrome with keratoconjunctivitis: Secondary | ICD-10-CM | POA: Diagnosis not present

## 2015-09-27 DIAGNOSIS — I509 Heart failure, unspecified: Secondary | ICD-10-CM | POA: Diagnosis not present

## 2015-09-27 DIAGNOSIS — R1310 Dysphagia, unspecified: Secondary | ICD-10-CM | POA: Diagnosis not present

## 2015-09-27 MED ORDER — ALBUTEROL SULFATE HFA 108 (90 BASE) MCG/ACT IN AERS: 2.0000 | INHALATION_SPRAY | Freq: Four times a day (QID) | RESPIRATORY_TRACT | Status: DC | PRN

## 2015-09-27 NOTE — Telephone Encounter (Signed)
-----   Message from Arnoldo Lenis, MD sent at 09/27/2015 11:19 AM EST ----- Lung tests do show some mild COPD, please start albuterol inhaler 2 puffs q6hrs prn shortness of breath  Zandra Abts MD

## 2015-09-27 NOTE — Telephone Encounter (Signed)
Pt aware, inhaler sent to pharmacy as requested. Routed to pcp

## 2015-10-07 DIAGNOSIS — K219 Gastro-esophageal reflux disease without esophagitis: Secondary | ICD-10-CM | POA: Diagnosis not present

## 2015-10-07 DIAGNOSIS — R1310 Dysphagia, unspecified: Secondary | ICD-10-CM | POA: Diagnosis not present

## 2015-10-11 DIAGNOSIS — K2970 Gastritis, unspecified, without bleeding: Secondary | ICD-10-CM | POA: Diagnosis not present

## 2015-10-11 DIAGNOSIS — Z833 Family history of diabetes mellitus: Secondary | ICD-10-CM | POA: Diagnosis not present

## 2015-10-11 DIAGNOSIS — Z87440 Personal history of urinary (tract) infections: Secondary | ICD-10-CM | POA: Diagnosis not present

## 2015-10-11 DIAGNOSIS — E119 Type 2 diabetes mellitus without complications: Secondary | ICD-10-CM | POA: Diagnosis not present

## 2015-10-11 DIAGNOSIS — K222 Esophageal obstruction: Secondary | ICD-10-CM | POA: Diagnosis not present

## 2015-10-11 DIAGNOSIS — Z7982 Long term (current) use of aspirin: Secondary | ICD-10-CM | POA: Diagnosis not present

## 2015-10-11 DIAGNOSIS — K2900 Acute gastritis without bleeding: Secondary | ICD-10-CM | POA: Diagnosis not present

## 2015-10-11 DIAGNOSIS — Z79899 Other long term (current) drug therapy: Secondary | ICD-10-CM | POA: Diagnosis not present

## 2015-10-11 DIAGNOSIS — I10 Essential (primary) hypertension: Secondary | ICD-10-CM | POA: Diagnosis not present

## 2015-10-11 DIAGNOSIS — K219 Gastro-esophageal reflux disease without esophagitis: Secondary | ICD-10-CM | POA: Diagnosis not present

## 2015-10-11 DIAGNOSIS — F329 Major depressive disorder, single episode, unspecified: Secondary | ICD-10-CM | POA: Diagnosis not present

## 2015-10-11 DIAGNOSIS — J449 Chronic obstructive pulmonary disease, unspecified: Secondary | ICD-10-CM | POA: Diagnosis not present

## 2015-10-11 DIAGNOSIS — Z87891 Personal history of nicotine dependence: Secondary | ICD-10-CM | POA: Diagnosis not present

## 2015-10-11 DIAGNOSIS — Z7984 Long term (current) use of oral hypoglycemic drugs: Secondary | ICD-10-CM | POA: Diagnosis not present

## 2015-10-11 DIAGNOSIS — Z9049 Acquired absence of other specified parts of digestive tract: Secondary | ICD-10-CM | POA: Diagnosis not present

## 2015-10-11 DIAGNOSIS — Z791 Long term (current) use of non-steroidal anti-inflammatories (NSAID): Secondary | ICD-10-CM | POA: Diagnosis not present

## 2015-10-11 DIAGNOSIS — E785 Hyperlipidemia, unspecified: Secondary | ICD-10-CM | POA: Diagnosis not present

## 2015-10-11 DIAGNOSIS — R1310 Dysphagia, unspecified: Secondary | ICD-10-CM | POA: Diagnosis not present

## 2015-10-11 DIAGNOSIS — Z7952 Long term (current) use of systemic steroids: Secondary | ICD-10-CM | POA: Diagnosis not present

## 2015-10-11 DIAGNOSIS — Z800 Family history of malignant neoplasm of digestive organs: Secondary | ICD-10-CM | POA: Diagnosis not present

## 2015-10-11 DIAGNOSIS — F419 Anxiety disorder, unspecified: Secondary | ICD-10-CM | POA: Diagnosis not present

## 2015-10-11 DIAGNOSIS — K449 Diaphragmatic hernia without obstruction or gangrene: Secondary | ICD-10-CM | POA: Diagnosis not present

## 2016-03-04 ENCOUNTER — Other Ambulatory Visit: Payer: Self-pay | Admitting: Cardiology

## 2016-03-16 DIAGNOSIS — D509 Iron deficiency anemia, unspecified: Secondary | ICD-10-CM | POA: Diagnosis not present

## 2016-03-16 DIAGNOSIS — E119 Type 2 diabetes mellitus without complications: Secondary | ICD-10-CM | POA: Diagnosis not present

## 2016-03-21 DIAGNOSIS — I509 Heart failure, unspecified: Secondary | ICD-10-CM | POA: Diagnosis not present

## 2016-03-21 DIAGNOSIS — M3501 Sicca syndrome with keratoconjunctivitis: Secondary | ICD-10-CM | POA: Diagnosis not present

## 2016-03-21 DIAGNOSIS — E119 Type 2 diabetes mellitus without complications: Secondary | ICD-10-CM | POA: Diagnosis not present

## 2016-03-21 DIAGNOSIS — R1310 Dysphagia, unspecified: Secondary | ICD-10-CM | POA: Diagnosis not present

## 2016-03-21 DIAGNOSIS — M160 Bilateral primary osteoarthritis of hip: Secondary | ICD-10-CM | POA: Diagnosis not present

## 2016-03-21 DIAGNOSIS — Z6824 Body mass index (BMI) 24.0-24.9, adult: Secondary | ICD-10-CM | POA: Diagnosis not present

## 2016-03-21 DIAGNOSIS — F411 Generalized anxiety disorder: Secondary | ICD-10-CM | POA: Diagnosis not present

## 2016-03-28 ENCOUNTER — Ambulatory Visit (INDEPENDENT_AMBULATORY_CARE_PROVIDER_SITE_OTHER): Payer: PPO | Admitting: Cardiology

## 2016-03-28 ENCOUNTER — Encounter: Payer: Self-pay | Admitting: Cardiology

## 2016-03-28 VITALS — BP 102/63 | HR 68 | Ht 65.5 in | Wt 148.0 lb

## 2016-03-28 DIAGNOSIS — I5022 Chronic systolic (congestive) heart failure: Secondary | ICD-10-CM

## 2016-03-28 MED ORDER — SIMVASTATIN 20 MG PO TABS: 20.0000 mg | ORAL_TABLET | Freq: Every day | ORAL | Status: DC

## 2016-03-28 MED ORDER — CARVEDILOL 12.5 MG PO TABS: 12.5000 mg | ORAL_TABLET | Freq: Two times a day (BID) | ORAL | Status: DC

## 2016-03-28 MED ORDER — LISINOPRIL 5 MG PO TABS: 5.0000 mg | ORAL_TABLET | Freq: Every day | ORAL | Status: DC

## 2016-03-28 NOTE — Patient Instructions (Signed)
Medication Instructions:   Refills sent to [facility] today for Coreg, Lisinopril, & Simvastatin (90 day supply).  Continue all other medications.    Labwork: NONE  Testing/Procedures: NONE  Follow-Up: Your physician wants you to follow up in: 6 months.  You will receive a reminder letter in the mail one-two months in advance.  If you don't receive a letter, please call our office to schedule the follow up appointment   Any Other Special Instructions Will Be Listed Below (If Applicable).  If you need a refill on your cardiac medications before your next appointment, please call your pharmacy.

## 2016-03-28 NOTE — Progress Notes (Signed)
Clinical Summary Kristin Crosby is a 65 y.o.female seen today for follow up of the following medical problems  1. Chronic Systolic heart failure - LVEF 40-45% by echo 02/2013, she is NYHA II.  - 02/2013 negative exercise stress MPI for ischemia  -medication titration has been limited due to soft bp's and some orthostatic dizziness. Taking torsemide only prn as needed, effective at controlling her LE edema.   - reports breathing is doing well. Can have some occasoinal LE edema at times.   2. Tobacco - former smoker x 30 years. Has intermittent episodes of SOB.  - mild obstruction on PFTs Jan 2017 - started on prn albuterol. Seems to be helping her symptoms.    SH: works as Programmer, systems Past Medical History  Diagnosis Date  . DM (diabetes mellitus) ([place])   . Low HDL (under 40)   . GERD (gastroesophageal reflux disease)   . History of tobacco use   . Sjogren's disease ([place])   . Fibromyalgia   . Panic disorder   . CHF (congestive heart failure) ([place])     Reported remote negative pharmacologic nuclear imaging study and echocardiogram  . Anemia      Allergies  Allergen Reactions  . Codeine Camsylate [Codeine] Itching     Current Outpatient Prescriptions  Medication Sig Dispense Refill  . albuterol (PROVENTIL HFA;VENTOLIN HFA) 108 (90 Base) MCG/ACT inhaler Inhale 2 puffs into the lungs every 6 (six) hours as needed for wheezing or shortness of breath. 1 Inhaler 2  . ALPRAZolam (XANAX) 0.5 MG tablet Take 0.5 mg by mouth 4 (four) times daily as needed.     Marland Kitchen aspirin 325 MG tablet Take 162 mg by mouth daily.    . Calcium Carb-Cholecalciferol (CALCIUM + D3 PO) Take 1 tablet by mouth 2 (two) times daily. Reported on 09/20/2015    . carvedilol (COREG) 12.5 MG tablet Take 1 tablet (12.5 mg total) by mouth 2 (two) times daily with a meal. 60 tablet 6  . CRANBERRY PO Take 1 tablet by mouth daily. Reported on 09/20/2015    . FLUoxetine (PROZAC) 20 MG capsule Take 60 mg by  mouth daily.    . hydrOXYzine (ATARAX/VISTARIL) 25 MG tablet Take 25 mg by mouth 2 (two) times daily.     Marland Kitchen ibuprofen (ADVIL,MOTRIN) 200 MG tablet Take 200 mg by mouth 4 (four) times daily as needed for pain.    Marland Kitchen lisinopril (PRINIVIL,ZESTRIL) 5 MG tablet TAKE ONE TABLET BY MOUTH ONCE DAILY 30 tablet 0  . meclizine (ANTIVERT) 25 MG tablet Take 25 mg by mouth 3 (three) times daily as needed for dizziness (vertigo).    . metFORMIN (GLUCOPHAGE) 500 MG tablet Take 1,000 mg by mouth 2 (two) times daily with a meal.    . Multiple Vitamin (MULTIVITAMIN) tablet Take 1 tablet by mouth daily. Reported on 09/20/2015    . nitroGLYCERIN (NITROSTAT) 0.4 MG SL tablet Place 0.4 mg under the tongue every 5 (five) minutes as needed for chest pain.    . pantoprazole (PROTONIX) 40 MG tablet Take 40 mg by mouth daily.    . potassium chloride SA (K-DUR,KLOR-CON) 20 MEQ tablet Take one tab by mouth as needed only on the days you take the Torsemide 30 tablet 3  . predniSONE (DELTASONE) 5 MG tablet Take 5 mg by mouth daily.     . simvastatin (ZOCOR) 20 MG tablet Take 1 tablet (20 mg total) by mouth daily. 30 tablet 6  . torsemide (DEMADEX) 20  MG tablet Take 1 tablet (20 mg total) by mouth every other day. 30 tablet 6   No current facility-administered medications for this visit.     No past surgical history on file.   Allergies  Allergen Reactions  . Codeine Camsylate [Codeine] Itching      Family History  Problem Relation Age of Onset  . Heart failure Mother   . Heart disease Father     CABG in 66'S     Social History Kristin Crosby reports that she quit smoking about 4 years ago. Her smoking use included Cigarettes. She started smoking about 51 years ago. She has a 55.5 pack-year smoking history. She has never used smokeless tobacco. Kristin Crosby has no alcohol history on file.   Review of Systems CONSTITUTIONAL: No weight loss, fever, chills, weakness or fatigue.  HEENT: Eyes: No visual loss,  blurred vision, double vision or yellow sclerae.No hearing loss, sneezing, congestion, runny nose or sore throat.  SKIN: No rash or itching.  CARDIOVASCULAR:  Per HPI RESPIRATORY: No shortness of breath, cough or sputum.  GASTROINTESTINAL: No anorexia, nausea, vomiting or diarrhea. No abdominal pain or blood.  GENITOURINARY: No burning on urination, no polyuria NEUROLOGICAL: No headache, dizziness, syncope, paralysis, ataxia, numbness or tingling in the extremities. No change in bowel or bladder control.  MUSCULOSKELETAL: No muscle, back pain, joint pain or stiffness.  LYMPHATICS: No enlarged nodes. No history of splenectomy.  PSYCHIATRIC: No history of depression or anxiety.  ENDOCRINOLOGIC: No reports of sweating, cold or heat intolerance. No polyuria or polydipsia.  Marland Kitchen   Physical Examination Filed Vitals:   03/28/16 1052  BP: 102/63  Pulse: 68   Filed Vitals:   03/28/16 1052  Height: 5' 5.5" (1.664 m)  Weight: 148 lb (67.132 kg)    Gen: resting comfortably, no acute distress HEENT: no scleral icterus, pupils equal round and reactive, no palptable cervical adenopathy,  CV: RRR, no m/r/g , no jvd Resp: Clear to auscultation bilaterally GI: abdomen is soft, non-tender, non-distended, normal bowel sounds, no hepatosplenomegaly MSK: extremities are warm, no edema.  Skin: warm, no rash Neuro:  no focal deficits Psych: appropriate affect   Diagnostic Studies 02/2013 Exercise MPI: exc 3 min, 7 METs, 93% THR, LVEF 48%, small fixed apical defect due to breast attenuation.   02/2013 Echo: LVEF A999333, Grade I diastolic dysfunction, hypokinetic apical septal wall. Akinetic basal anterolateral and basal anterior walls. Mild MR   Jan 2017 PFTs: mild obstruction   Assessment and Plan  1. Chronic systolic heart failure - no current symptoms - continue current meds, soft bp's, will not further titrate at this time.        Arnoldo Lenis, M.D.

## 2016-04-09 DIAGNOSIS — Z1231 Encounter for screening mammogram for malignant neoplasm of breast: Secondary | ICD-10-CM | POA: Diagnosis not present

## 2016-04-13 ENCOUNTER — Other Ambulatory Visit: Payer: Self-pay | Admitting: Cardiology

## 2016-04-16 DIAGNOSIS — Z87891 Personal history of nicotine dependence: Secondary | ICD-10-CM | POA: Diagnosis not present

## 2016-04-16 DIAGNOSIS — Z7984 Long term (current) use of oral hypoglycemic drugs: Secondary | ICD-10-CM | POA: Diagnosis not present

## 2016-04-16 DIAGNOSIS — K219 Gastro-esophageal reflux disease without esophagitis: Secondary | ICD-10-CM | POA: Diagnosis not present

## 2016-04-16 DIAGNOSIS — I509 Heart failure, unspecified: Secondary | ICD-10-CM | POA: Diagnosis not present

## 2016-04-16 DIAGNOSIS — Z79899 Other long term (current) drug therapy: Secondary | ICD-10-CM | POA: Diagnosis not present

## 2016-04-16 DIAGNOSIS — M8588 Other specified disorders of bone density and structure, other site: Secondary | ICD-10-CM | POA: Diagnosis not present

## 2016-04-16 DIAGNOSIS — I10 Essential (primary) hypertension: Secondary | ICD-10-CM | POA: Diagnosis not present

## 2016-04-16 DIAGNOSIS — E119 Type 2 diabetes mellitus without complications: Secondary | ICD-10-CM | POA: Diagnosis not present

## 2016-04-16 DIAGNOSIS — Z7982 Long term (current) use of aspirin: Secondary | ICD-10-CM | POA: Diagnosis not present

## 2016-04-16 DIAGNOSIS — M810 Age-related osteoporosis without current pathological fracture: Secondary | ICD-10-CM | POA: Diagnosis not present

## 2016-04-16 DIAGNOSIS — Z780 Asymptomatic menopausal state: Secondary | ICD-10-CM | POA: Diagnosis not present

## 2016-04-16 DIAGNOSIS — J449 Chronic obstructive pulmonary disease, unspecified: Secondary | ICD-10-CM | POA: Diagnosis not present

## 2016-04-16 DIAGNOSIS — E7800 Pure hypercholesterolemia, unspecified: Secondary | ICD-10-CM | POA: Diagnosis not present

## 2016-04-16 DIAGNOSIS — F329 Major depressive disorder, single episode, unspecified: Secondary | ICD-10-CM | POA: Diagnosis not present

## 2016-07-18 DIAGNOSIS — D239 Other benign neoplasm of skin, unspecified: Secondary | ICD-10-CM | POA: Diagnosis not present

## 2016-07-18 DIAGNOSIS — L820 Inflamed seborrheic keratosis: Secondary | ICD-10-CM | POA: Diagnosis not present

## 2016-07-18 DIAGNOSIS — Z6825 Body mass index (BMI) 25.0-25.9, adult: Secondary | ICD-10-CM | POA: Diagnosis not present

## 2016-07-24 DIAGNOSIS — D509 Iron deficiency anemia, unspecified: Secondary | ICD-10-CM | POA: Diagnosis not present

## 2016-08-02 DIAGNOSIS — Z23 Encounter for immunization: Secondary | ICD-10-CM | POA: Diagnosis not present

## 2016-09-14 DIAGNOSIS — D509 Iron deficiency anemia, unspecified: Secondary | ICD-10-CM | POA: Diagnosis not present

## 2016-09-14 DIAGNOSIS — E119 Type 2 diabetes mellitus without complications: Secondary | ICD-10-CM | POA: Diagnosis not present

## 2016-09-21 DIAGNOSIS — I509 Heart failure, unspecified: Secondary | ICD-10-CM | POA: Diagnosis not present

## 2016-09-21 DIAGNOSIS — M3501 Sicca syndrome with keratoconjunctivitis: Secondary | ICD-10-CM | POA: Diagnosis not present

## 2016-09-21 DIAGNOSIS — E119 Type 2 diabetes mellitus without complications: Secondary | ICD-10-CM | POA: Diagnosis not present

## 2016-09-21 DIAGNOSIS — Z1389 Encounter for screening for other disorder: Secondary | ICD-10-CM | POA: Diagnosis not present

## 2016-09-21 DIAGNOSIS — J441 Chronic obstructive pulmonary disease with (acute) exacerbation: Secondary | ICD-10-CM | POA: Diagnosis not present

## 2016-09-21 DIAGNOSIS — M160 Bilateral primary osteoarthritis of hip: Secondary | ICD-10-CM | POA: Diagnosis not present

## 2016-09-21 DIAGNOSIS — F411 Generalized anxiety disorder: Secondary | ICD-10-CM | POA: Diagnosis not present

## 2016-09-21 DIAGNOSIS — Z6826 Body mass index (BMI) 26.0-26.9, adult: Secondary | ICD-10-CM | POA: Diagnosis not present

## 2016-09-28 ENCOUNTER — Encounter: Payer: Self-pay | Admitting: Cardiology

## 2016-09-28 ENCOUNTER — Ambulatory Visit (INDEPENDENT_AMBULATORY_CARE_PROVIDER_SITE_OTHER): Payer: PPO | Admitting: Cardiology

## 2016-09-28 VITALS — BP 110/62 | HR 73 | Ht 64.0 in | Wt 150.0 lb

## 2016-09-28 DIAGNOSIS — I5022 Chronic systolic (congestive) heart failure: Secondary | ICD-10-CM

## 2016-09-28 NOTE — Progress Notes (Signed)
Clinical Summary [name] is a 66 y.o.female seen today for follow up of the following medical problems  1. Chronic Systolic heart failure - LVEF 40-45% by echo 02/2013, she is NYHA II.  - 02/2013 negative exercise stress MPI for ischemia  -medication titration has been limited due to soft bp's and some orthostatic dizziness. Taking torsemide only prn as needed, effective at controlling her LE edema.   - recent SOB after recent cold. Better with predisone - occasilnal LE edema, occasional abdominal distension.   2. Tobacco - former smoker x 30 years. Has intermittent episodes of SOB.  - mild obstruction on PFTs Jan 2017 - started on prn albuterol.  - symptoms overall [place]   SH: works as Programmer, systems   Past Medical History:  Diagnosis Date  . Anemia   . CHF (congestive heart failure) ([place])    Reported remote negative pharmacologic nuclear imaging study and echocardiogram  . DM (diabetes mellitus) ([place])   . Fibromyalgia   . GERD (gastroesophageal reflux disease)   . History of tobacco use   . Low HDL (under 40)   . Panic disorder   . Sjogren's disease ([place])      Allergies  Allergen Reactions  . Codeine Camsylate [Codeine] Itching     Current Outpatient Prescriptions  Medication Sig Dispense Refill  . albuterol (PROVENTIL HFA;VENTOLIN HFA) 108 (90 Base) MCG/ACT inhaler Inhale 2 puffs into the lungs every 6 (six) hours as needed for wheezing or shortness of breath. 1 Inhaler 2  . ALPRAZolam (XANAX) 0.5 MG tablet Take 0.5 mg by mouth 4 (four) times daily as needed.     Marland Kitchen aspirin EC 81 MG tablet Take 81 mg by mouth daily.    . Calcium Carb-Cholecalciferol (CALCIUM + D3 PO) Take 1 tablet by mouth 2 (two) times daily. Reported on 09/20/2015    . carvedilol (COREG) 12.5 MG tablet Take 1 tablet (12.5 mg total) by mouth 2 (two) times daily. 180 tablet 3  . CRANBERRY PO Take 15,000 mg by mouth daily. Reported on 09/20/2015    . FLUoxetine (PROZAC) 20 MG  capsule Take 60 mg by mouth daily.    . hydrOXYzine (ATARAX/VISTARIL) 25 MG tablet Take 25 mg by mouth 2 (two) times daily.     Marland Kitchen ibuprofen (ADVIL,MOTRIN) 200 MG tablet Take 200 mg by mouth 4 (four) times daily as needed for pain.    Marland Kitchen KLOR-CON M20 20 MEQ tablet TAKE ONE TABLET BY MOUTH ONCE DAILY AS NEEDED ONLY ON THE DAYS YOU TAKE TORSEMIDE 30 tablet 4  . lisinopril (PRINIVIL,ZESTRIL) 5 MG tablet Take 1 tablet (5 mg total) by mouth daily. 90 tablet 3  . meclizine (ANTIVERT) 25 MG tablet Take 25 mg by mouth 3 (three) times daily as needed for dizziness (vertigo).    . metFORMIN (GLUCOPHAGE) 500 MG tablet Take 1,000 mg by mouth 2 (two) times daily with a meal.    . Multiple Vitamin (MULTIVITAMIN) tablet Take 1 tablet by mouth daily. Reported on 09/20/2015    . nitroGLYCERIN (NITROSTAT) 0.4 MG SL tablet Place 0.4 mg under the tongue every 5 (five) minutes as needed for chest pain.    . pantoprazole (PROTONIX) 40 MG tablet Take 40 mg by mouth daily.    . predniSONE (DELTASONE) 5 MG tablet Take 5 mg by mouth daily.     . simvastatin (ZOCOR) 20 MG tablet Take 1 tablet (20 mg total) by mouth daily. 90 tablet 3  . torsemide (DEMADEX) 20  MG tablet Take 1 tablet (20 mg total) by mouth every other day. 30 tablet 6   No current facility-administered medications for this visit.      No past surgical history on file.   Allergies  Allergen Reactions  . Codeine Camsylate [Codeine] Itching      Family History  Problem Relation Age of Onset  . Heart failure Mother   . Heart disease Father     CABG in 77'S     Social History Ms. Jenson reports that she quit smoking about 5 years ago. Her smoking use included Cigarettes. She started smoking about 52 years ago. She has a 55.50 pack-year smoking history. She has never used smokeless tobacco. Ms. Allery has no alcohol history on file.   Review of Systems CONSTITUTIONAL: No weight loss, fever, chills, weakness or fatigue.  HEENT: Eyes: No  visual loss, blurred vision, double vision or yellow sclerae.No hearing loss, sneezing, congestion, runny nose or sore throat.  SKIN: No rash or itching.  CARDIOVASCULAR: per HPI RESPIRATORY: occas SOB GASTROINTESTINAL: No anorexia, nausea, vomiting or diarrhea. No abdominal pain or blood.  GENITOURINARY: No burning on urination, no polyuria NEUROLOGICAL: No headache, dizziness, syncope, paralysis, ataxia, numbness or tingling in the extremities. No change in bowel or bladder control.  MUSCULOSKELETAL: No muscle, back pain, joint pain or stiffness.  LYMPHATICS: No enlarged nodes. No history of splenectomy.  PSYCHIATRIC: No history of depression or anxiety.  ENDOCRINOLOGIC: No reports of sweating, cold or heat intolerance. No polyuria or polydipsia.  Marland Kitchen   Physical Examination Vitals:   09/28/16 1111  BP: 110/62  Pulse: 73   Vitals:   09/28/16 1111  Weight: 150 lb (68 kg)  Height: 5\' 4"  (1.626 m)    Gen: resting comfortably, no acute distress HEENT: no scleral icterus, pupils equal round and reactive, no palptable cervical adenopathy,  CV: RRR, no m/r/g, no jvd Resp: Clear to auscultation bilaterally GI: abdomen is soft, non-tender, non-distended, normal bowel sounds, no hepatosplenomegaly MSK: extremities are warm, no edema.  Skin: warm, no rash Neuro:  no focal deficits Psych: appropriate affect   Diagnostic Studies 02/2013 Exercise MPI: exc 3 min, 7 METs, 93% THR, LVEF 48%, small fixed apical defect due to breast attenuation.   02/2013 Echo: LVEF A999333, Grade I diastolic dysfunction, hypokinetic apical septal wall. Akinetic basal anterolateral and basal anterior walls. Mild MR   Jan 2017 PFTs: mild obstruction    Assessment and Plan  1. Chronic systolic heart failure - no recent symptoms - we will repeat echo, if persistent LV dysfunction try to further titrate meds   F/u 6 months      Arnoldo Lenis, M.D.

## 2016-09-28 NOTE — Patient Instructions (Signed)
Your physician wants you to follow-up in: [place] DR. BRANCH You will receive a reminder letter in the mail two months in advance. If you don't receive a letter, please call our office to schedule the follow-up appointment.  Your physician recommends that you continue on your current medications as directed. Please refer to the Current Medication list given to you today.  Your physician has requested that you have an echocardiogram. Echocardiography is a painless test that uses sound waves to create images of your heart. It provides your doctor with information about the size and shape of your heart and how well your heart's chambers and valves are working. This procedure takes approximately one hour. There are no restrictions for this procedure.  Your physician recommends that you return for lab work BMP  Thank you for choosing [facility]!!

## 2016-10-16 ENCOUNTER — Encounter (HOSPITAL_COMMUNITY): Payer: Self-pay | Admitting: Psychiatry

## 2016-10-16 ENCOUNTER — Ambulatory Visit (INDEPENDENT_AMBULATORY_CARE_PROVIDER_SITE_OTHER): Payer: PPO | Admitting: Psychiatry

## 2016-10-16 VITALS — BP 114/64 | HR 75 | Ht 64.0 in | Wt 151.4 lb

## 2016-10-16 DIAGNOSIS — Z8249 Family history of ischemic heart disease and other diseases of the circulatory system: Secondary | ICD-10-CM | POA: Diagnosis not present

## 2016-10-16 DIAGNOSIS — F331 Major depressive disorder, recurrent, moderate: Secondary | ICD-10-CM | POA: Diagnosis not present

## 2016-10-16 DIAGNOSIS — Z79899 Other long term (current) drug therapy: Secondary | ICD-10-CM

## 2016-10-16 DIAGNOSIS — Z7982 Long term (current) use of aspirin: Secondary | ICD-10-CM

## 2016-10-16 DIAGNOSIS — Z888 Allergy status to other drugs, medicaments and biological substances status: Secondary | ICD-10-CM

## 2016-10-16 DIAGNOSIS — F4310 Post-traumatic stress disorder, unspecified: Secondary | ICD-10-CM | POA: Diagnosis not present

## 2016-10-16 DIAGNOSIS — Z87891 Personal history of nicotine dependence: Secondary | ICD-10-CM

## 2016-10-16 DIAGNOSIS — Z811 Family history of alcohol abuse and dependence: Secondary | ICD-10-CM | POA: Diagnosis not present

## 2016-10-16 MED ORDER — LORAZEPAM 1 MG PO TABS: ORAL_TABLET | ORAL | 0 refills | Status: DC

## 2016-10-16 MED ORDER — QUETIAPINE FUMARATE 25 MG PO TABS: 25.0000 mg | ORAL_TABLET | Freq: Every day | ORAL | 2 refills | Status: DC
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
## 2016-10-16 NOTE — Patient Instructions (Signed)
1. Continue fluoxetine 60 mg daily 2. Start quetiapine 25 mg at night 3. Discontinue Xanax 4. Start lorazepam 1 mg twice a day and 0.5 mg at night as needed for anxiety 5. Return to clinic in one month

## 2016-10-18 ENCOUNTER — Other Ambulatory Visit: Payer: Self-pay

## 2016-10-18 ENCOUNTER — Ambulatory Visit (INDEPENDENT_AMBULATORY_CARE_PROVIDER_SITE_OTHER): Payer: PPO

## 2016-10-18 DIAGNOSIS — I5022 Chronic systolic (congestive) heart failure: Secondary | ICD-10-CM

## 2016-10-23 ENCOUNTER — Ambulatory Visit (HOSPITAL_COMMUNITY): Payer: Self-pay | Admitting: Psychiatry

## 2016-10-25 ENCOUNTER — Telehealth: Payer: Self-pay | Admitting: *Deleted

## 2016-10-25 NOTE — Telephone Encounter (Signed)
Pt aware - routed to pcp  

## 2016-10-25 NOTE — Telephone Encounter (Signed)
-----   Message from Arnoldo Lenis, MD sent at 10/25/2016  1:19 PM EST ----- Echo shows heart function remains just slightly below normal, we will continue current meds  Zandra Abts MD

## 2016-10-26 DIAGNOSIS — I509 Heart failure, unspecified: Secondary | ICD-10-CM | POA: Diagnosis not present

## 2016-10-30 ENCOUNTER — Telehealth: Payer: Self-pay | Admitting: *Deleted

## 2016-10-30 NOTE — Telephone Encounter (Signed)
Pt aware - routed to pcp  

## 2016-10-30 NOTE — Telephone Encounter (Signed)
-----   Message from Arnoldo Lenis, MD sent at 10/26/2016  3:59 PM EST ----- Labs look good  Zandra Abts MD
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
## 2016-11-13 ENCOUNTER — Encounter (HOSPITAL_COMMUNITY): Payer: Self-pay | Admitting: Psychiatry

## 2016-11-13 ENCOUNTER — Ambulatory Visit (INDEPENDENT_AMBULATORY_CARE_PROVIDER_SITE_OTHER): Payer: PPO | Admitting: Psychiatry

## 2016-11-13 VITALS — BP 126/68 | HR 72 | Wt 154.6 lb

## 2016-11-13 DIAGNOSIS — Z8249 Family history of ischemic heart disease and other diseases of the circulatory system: Secondary | ICD-10-CM

## 2016-11-13 DIAGNOSIS — F4310 Post-traumatic stress disorder, unspecified: Secondary | ICD-10-CM | POA: Diagnosis not present

## 2016-11-13 DIAGNOSIS — Z79899 Other long term (current) drug therapy: Secondary | ICD-10-CM

## 2016-11-13 DIAGNOSIS — F331 Major depressive disorder, recurrent, moderate: Secondary | ICD-10-CM | POA: Diagnosis not present

## 2016-11-13 DIAGNOSIS — Z87891 Personal history of nicotine dependence: Secondary | ICD-10-CM

## 2016-11-13 DIAGNOSIS — Z888 Allergy status to other drugs, medicaments and biological substances status: Secondary | ICD-10-CM

## 2016-11-13 MED ORDER — QUETIAPINE FUMARATE 25 MG PO TABS: 25.0000 mg | ORAL_TABLET | Freq: Every day | ORAL | 2 refills | Status: DC

## 2016-11-13 MED ORDER — LORAZEPAM 1 MG PO TABS: ORAL_TABLET | ORAL | 0 refills | Status: DC

## 2016-11-13 MED ORDER — PRAZOSIN HCL 1 MG PO CAPS: 1.0000 mg | ORAL_CAPSULE | Freq: Every day | ORAL | 0 refills | Status: DC

## 2016-11-13 NOTE — Patient Instructions (Addendum)
1. Continue fluoxetine 60 mg daily 2. Continue quetiapine 25 mg at night 3. Start Prazosin 1 mg at night for 3 days, then 2 mg  4. Continue lorazepam 1 mg twice a day and 0.5 mg at night as needed for anxiety 5. Return to clinic in one month 6. Contact for therapy: Dr. Alford Highland Schneidmiller  [phone] [address] [place], [place] [address]

## 2016-11-14 ENCOUNTER — Telehealth (HOSPITAL_COMMUNITY): Payer: Self-pay | Admitting: Psychiatry

## 2016-11-14 ENCOUNTER — Telehealth (HOSPITAL_COMMUNITY): Payer: Self-pay | Admitting: *Deleted

## 2016-11-14 NOTE — Telephone Encounter (Signed)
Per fax from [facility], they need clarifications for pt Minipress. They wanted to know the SIG for pt medication due to it not being clear and did not want to fill the wrong thing. Per Dr. Modesta Messing to call pt pharmacy and informed them that she wants pt SIG for her MInipress to be take 2 mg QHS. Called pt pharmacy and spoke with [facility] and informed her with what provider stated. Dawn verbalized understanding.

## 2016-11-14 NOTE — Telephone Encounter (Signed)
Informed patient that the letter signed yesterday in her chart was created in error. It is under the process to delete the letter from the chart. Patient verbalizes her understanding.
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
## 2016-12-11 ENCOUNTER — Ambulatory Visit (INDEPENDENT_AMBULATORY_CARE_PROVIDER_SITE_OTHER): Payer: PPO | Admitting: Psychiatry

## 2016-12-11 ENCOUNTER — Encounter (HOSPITAL_COMMUNITY): Payer: Self-pay | Admitting: Psychiatry

## 2016-12-11 VITALS — BP 111/63 | HR 83 | Ht 64.0 in | Wt 153.3 lb

## 2016-12-11 DIAGNOSIS — Z87891 Personal history of nicotine dependence: Secondary | ICD-10-CM | POA: Diagnosis not present

## 2016-12-11 DIAGNOSIS — F4310 Post-traumatic stress disorder, unspecified: Secondary | ICD-10-CM

## 2016-12-11 DIAGNOSIS — Z888 Allergy status to other drugs, medicaments and biological substances status: Secondary | ICD-10-CM | POA: Diagnosis not present

## 2016-12-11 DIAGNOSIS — F331 Major depressive disorder, recurrent, moderate: Secondary | ICD-10-CM

## 2016-12-11 DIAGNOSIS — Z79899 Other long term (current) drug therapy: Secondary | ICD-10-CM | POA: Diagnosis not present

## 2016-12-11 MED ORDER — QUETIAPINE FUMARATE 25 MG PO TABS: 25.0000 mg | ORAL_TABLET | Freq: Every day | ORAL | 1 refills | Status: DC

## 2016-12-11 MED ORDER — LORAZEPAM 1 MG PO TABS: ORAL_TABLET | ORAL | 0 refills | Status: DC

## 2016-12-11 NOTE — Patient Instructions (Signed)
1. Continue fluoxetine 60 mg daily 2. Continue quetiapine 25 mg at night 3. Discontinue Prazosin 4. Continue lorazepam 1 mg twice a day as needed for anxiety 5. Return to clinic in one month

## 2016-12-25 DIAGNOSIS — L03115 Cellulitis of right lower limb: Secondary | ICD-10-CM | POA: Diagnosis not present

## 2016-12-25 DIAGNOSIS — E119 Type 2 diabetes mellitus without complications: Secondary | ICD-10-CM | POA: Diagnosis not present

## 2016-12-25 DIAGNOSIS — Z6826 Body mass index (BMI) 26.0-26.9, adult: Secondary | ICD-10-CM | POA: Diagnosis not present
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
## 2017-01-07 ENCOUNTER — Encounter (HOSPITAL_COMMUNITY): Payer: Self-pay | Admitting: Psychiatry

## 2017-01-07 ENCOUNTER — Ambulatory Visit (INDEPENDENT_AMBULATORY_CARE_PROVIDER_SITE_OTHER): Payer: PPO | Admitting: Psychiatry

## 2017-01-07 VITALS — BP 114/66 | HR 82 | Ht 64.0 in | Wt 156.6 lb

## 2017-01-07 DIAGNOSIS — R413 Other amnesia: Secondary | ICD-10-CM

## 2017-01-07 DIAGNOSIS — Z87891 Personal history of nicotine dependence: Secondary | ICD-10-CM | POA: Diagnosis not present

## 2017-01-07 DIAGNOSIS — Z79899 Other long term (current) drug therapy: Secondary | ICD-10-CM | POA: Diagnosis not present

## 2017-01-07 DIAGNOSIS — F331 Major depressive disorder, recurrent, moderate: Secondary | ICD-10-CM

## 2017-01-07 DIAGNOSIS — F4310 Post-traumatic stress disorder, unspecified: Secondary | ICD-10-CM

## 2017-01-07 DIAGNOSIS — Z811 Family history of alcohol abuse and dependence: Secondary | ICD-10-CM

## 2017-01-07 MED ORDER — QUETIAPINE FUMARATE 25 MG PO TABS: 25.0000 mg | ORAL_TABLET | Freq: Every day | ORAL | 1 refills | Status: DC

## 2017-01-07 MED ORDER — LORAZEPAM 1 MG PO TABS: ORAL_TABLET | ORAL | 0 refills | Status: DC

## 2017-01-07 MED ORDER — TRAZODONE HCL 50 MG PO TABS: ORAL_TABLET | ORAL | 1 refills | Status: DC

## 2017-01-07 MED ORDER — FLUOXETINE HCL 20 MG PO CAPS: 60.0000 mg | ORAL_CAPSULE | Freq: Every day | ORAL | 1 refills | Status: DC

## 2017-01-07 NOTE — Patient Instructions (Signed)
1. Continue fluoxetine 60 mg daily 2. Continue quetiapine 25 mg at night 3. Continue lorazepam 1 mg twice a day as needed for anxiety 4. Start Trazone 25-50 mg at night as needed for sleep 5. Return to clinic in one month
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
## 2017-02-04 DIAGNOSIS — F419 Anxiety disorder, unspecified: Secondary | ICD-10-CM | POA: Diagnosis not present

## 2017-02-04 DIAGNOSIS — M3501 Sicca syndrome with keratoconjunctivitis: Secondary | ICD-10-CM | POA: Diagnosis not present

## 2017-02-04 DIAGNOSIS — Z7952 Long term (current) use of systemic steroids: Secondary | ICD-10-CM | POA: Diagnosis not present

## 2017-02-04 DIAGNOSIS — M8580 Other specified disorders of bone density and structure, unspecified site: Secondary | ICD-10-CM | POA: Diagnosis not present

## 2017-02-06 ENCOUNTER — Ambulatory Visit (INDEPENDENT_AMBULATORY_CARE_PROVIDER_SITE_OTHER): Payer: PPO | Admitting: Psychiatry

## 2017-02-06 ENCOUNTER — Encounter (HOSPITAL_COMMUNITY): Payer: Self-pay | Admitting: Psychiatry

## 2017-02-06 VITALS — BP 120/53 | HR 80 | Ht 64.0 in | Wt 156.6 lb

## 2017-02-06 DIAGNOSIS — F4310 Post-traumatic stress disorder, unspecified: Secondary | ICD-10-CM

## 2017-02-06 DIAGNOSIS — M3500 Sicca syndrome, unspecified: Secondary | ICD-10-CM | POA: Diagnosis not present

## 2017-02-06 DIAGNOSIS — F329 Major depressive disorder, single episode, unspecified: Secondary | ICD-10-CM | POA: Diagnosis not present

## 2017-02-06 DIAGNOSIS — Z811 Family history of alcohol abuse and dependence: Secondary | ICD-10-CM

## 2017-02-06 DIAGNOSIS — Z7984 Long term (current) use of oral hypoglycemic drugs: Secondary | ICD-10-CM | POA: Diagnosis not present

## 2017-02-06 DIAGNOSIS — Z87891 Personal history of nicotine dependence: Secondary | ICD-10-CM | POA: Diagnosis not present

## 2017-02-06 DIAGNOSIS — F419 Anxiety disorder, unspecified: Secondary | ICD-10-CM

## 2017-02-06 DIAGNOSIS — Z79899 Other long term (current) drug therapy: Secondary | ICD-10-CM | POA: Diagnosis not present

## 2017-02-06 DIAGNOSIS — F331 Major depressive disorder, recurrent, moderate: Secondary | ICD-10-CM

## 2017-02-06 DIAGNOSIS — J449 Chronic obstructive pulmonary disease, unspecified: Secondary | ICD-10-CM

## 2017-02-06 DIAGNOSIS — Z818 Family history of other mental and behavioral disorders: Secondary | ICD-10-CM

## 2017-02-06 DIAGNOSIS — Z888 Allergy status to other drugs, medicaments and biological substances status: Secondary | ICD-10-CM | POA: Diagnosis not present

## 2017-02-06 DIAGNOSIS — Z7982 Long term (current) use of aspirin: Secondary | ICD-10-CM

## 2017-02-06 DIAGNOSIS — E119 Type 2 diabetes mellitus without complications: Secondary | ICD-10-CM | POA: Diagnosis not present

## 2017-02-06 DIAGNOSIS — I5022 Chronic systolic (congestive) heart failure: Secondary | ICD-10-CM

## 2017-02-06 MED ORDER — LORAZEPAM 1 MG PO TABS: ORAL_TABLET | ORAL | 0 refills | Status: DC

## 2017-02-06 MED ORDER — QUETIAPINE FUMARATE 25 MG PO TABS: 25.0000 mg | ORAL_TABLET | Freq: Every day | ORAL | 1 refills | Status: DC

## 2017-02-06 MED ORDER — TRAZODONE HCL 50 MG PO TABS: ORAL_TABLET | ORAL | 1 refills | Status: DC

## 2017-02-06 MED ORDER — FLUOXETINE HCL 40 MG PO CAPS: 80.0000 mg | ORAL_CAPSULE | Freq: Every day | ORAL | 1 refills | Status: DC

## 2017-02-06 NOTE — Patient Instructions (Addendum)
1. Increase fluoxetine 80 mg daily 2. Continue quetiapine 25 mg at night 3. Continue lorazepam 1 mg twice a day and 0.5 mg daily as needed for anxiety 4. Start Trazone 25-50 mg at night as needed for sleep 5. Return to clinic in one month 6. Referral to therapy

## 2017-02-13 ENCOUNTER — Encounter (HOSPITAL_COMMUNITY): Payer: Self-pay | Admitting: Licensed Clinical Social Worker

## 2017-02-13 ENCOUNTER — Ambulatory Visit (INDEPENDENT_AMBULATORY_CARE_PROVIDER_SITE_OTHER): Payer: PPO | Admitting: Licensed Clinical Social Worker

## 2017-02-13 DIAGNOSIS — F331 Major depressive disorder, recurrent, moderate: Secondary | ICD-10-CM

## 2017-02-13 DIAGNOSIS — F411 Generalized anxiety disorder: Secondary | ICD-10-CM
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
## 2017-02-28 ENCOUNTER — Ambulatory Visit (HOSPITAL_COMMUNITY): Payer: Self-pay | Admitting: Licensed Clinical Social Worker

## 2017-03-04 ENCOUNTER — Ambulatory Visit (INDEPENDENT_AMBULATORY_CARE_PROVIDER_SITE_OTHER): Payer: PPO | Admitting: Licensed Clinical Social Worker

## 2017-03-04 ENCOUNTER — Encounter (HOSPITAL_COMMUNITY): Payer: Self-pay | Admitting: Licensed Clinical Social Worker

## 2017-03-04 DIAGNOSIS — F411 Generalized anxiety disorder: Secondary | ICD-10-CM

## 2017-03-04 DIAGNOSIS — F331 Major depressive disorder, recurrent, moderate: Secondary | ICD-10-CM
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
## 2017-03-07 ENCOUNTER — Ambulatory Visit (INDEPENDENT_AMBULATORY_CARE_PROVIDER_SITE_OTHER): Payer: PPO | Admitting: Psychiatry

## 2017-03-07 VITALS — BP 125/51 | HR 85 | Ht 64.0 in | Wt 157.0 lb

## 2017-03-07 DIAGNOSIS — J449 Chronic obstructive pulmonary disease, unspecified: Secondary | ICD-10-CM | POA: Diagnosis not present

## 2017-03-07 DIAGNOSIS — Z7984 Long term (current) use of oral hypoglycemic drugs: Secondary | ICD-10-CM

## 2017-03-07 DIAGNOSIS — Z7982 Long term (current) use of aspirin: Secondary | ICD-10-CM

## 2017-03-07 DIAGNOSIS — F329 Major depressive disorder, single episode, unspecified: Secondary | ICD-10-CM | POA: Diagnosis not present

## 2017-03-07 DIAGNOSIS — Z79899 Other long term (current) drug therapy: Secondary | ICD-10-CM | POA: Diagnosis not present

## 2017-03-07 DIAGNOSIS — E1169 Type 2 diabetes mellitus with other specified complication: Secondary | ICD-10-CM | POA: Diagnosis not present

## 2017-03-07 DIAGNOSIS — I5022 Chronic systolic (congestive) heart failure: Secondary | ICD-10-CM | POA: Diagnosis not present

## 2017-03-07 DIAGNOSIS — Z818 Family history of other mental and behavioral disorders: Secondary | ICD-10-CM

## 2017-03-07 DIAGNOSIS — F331 Major depressive disorder, recurrent, moderate: Secondary | ICD-10-CM

## 2017-03-07 DIAGNOSIS — F4310 Post-traumatic stress disorder, unspecified: Secondary | ICD-10-CM | POA: Diagnosis not present

## 2017-03-07 DIAGNOSIS — F419 Anxiety disorder, unspecified: Secondary | ICD-10-CM

## 2017-03-07 DIAGNOSIS — Z811 Family history of alcohol abuse and dependence: Secondary | ICD-10-CM

## 2017-03-07 DIAGNOSIS — Z87891 Personal history of nicotine dependence: Secondary | ICD-10-CM

## 2017-03-07 DIAGNOSIS — E785 Hyperlipidemia, unspecified: Secondary | ICD-10-CM | POA: Diagnosis not present

## 2017-03-07 MED ORDER — TRAZODONE HCL 50 MG PO TABS: ORAL_TABLET | ORAL | 1 refills | Status: DC

## 2017-03-07 MED ORDER — LORAZEPAM 1 MG PO TABS
ORAL_TABLET | ORAL | 0 refills | Status: DC
Start: 2017-03-07 — End: 2017-04-04

## 2017-03-07 MED ORDER — QUETIAPINE FUMARATE 25 MG PO TABS: 25.0000 mg | ORAL_TABLET | Freq: Every day | ORAL | 1 refills | Status: DC

## 2017-03-07 NOTE — Patient Instructions (Addendum)
1. Continue fluoxetine 80 mg daily 2. Continue quetiapine 25 mg at night 3. Continue lorazepam 1 mg twice a day and 0.5 mg daily as needed for anxiety 4. Discontinue Trazodone 5. Return to clinic in one month

## 2017-03-18 DIAGNOSIS — J441 Chronic obstructive pulmonary disease with (acute) exacerbation: Secondary | ICD-10-CM | POA: Diagnosis not present

## 2017-03-18 DIAGNOSIS — D509 Iron deficiency anemia, unspecified: Secondary | ICD-10-CM | POA: Diagnosis not present

## 2017-03-18 DIAGNOSIS — I509 Heart failure, unspecified: Secondary | ICD-10-CM | POA: Diagnosis not present

## 2017-03-18 DIAGNOSIS — F4312 Post-traumatic stress disorder, chronic: Secondary | ICD-10-CM | POA: Diagnosis not present

## 2017-03-18 DIAGNOSIS — F3341 Major depressive disorder, recurrent, in partial remission: Secondary | ICD-10-CM | POA: Diagnosis not present

## 2017-03-18 DIAGNOSIS — E119 Type 2 diabetes mellitus without complications: Secondary | ICD-10-CM | POA: Diagnosis not present

## 2017-03-18 DIAGNOSIS — K5790 Diverticulosis of intestine, part unspecified, without perforation or abscess without bleeding: Secondary | ICD-10-CM | POA: Diagnosis not present

## 2017-03-20 ENCOUNTER — Other Ambulatory Visit: Payer: Self-pay | Admitting: Cardiology

## 2017-03-21 ENCOUNTER — Encounter (HOSPITAL_COMMUNITY): Payer: Self-pay | Admitting: Licensed Clinical Social Worker

## 2017-03-21 ENCOUNTER — Ambulatory Visit (INDEPENDENT_AMBULATORY_CARE_PROVIDER_SITE_OTHER): Payer: PPO | Admitting: Licensed Clinical Social Worker

## 2017-03-21 DIAGNOSIS — F331 Major depressive disorder, recurrent, moderate: Secondary | ICD-10-CM

## 2017-03-21 DIAGNOSIS — F411 Generalized anxiety disorder: Secondary | ICD-10-CM | POA: Diagnosis not present

## 2017-03-21 DIAGNOSIS — F4312 Post-traumatic stress disorder, chronic: Secondary | ICD-10-CM | POA: Diagnosis not present

## 2017-03-21 DIAGNOSIS — Z0000 Encounter for general adult medical examination without abnormal findings: Secondary | ICD-10-CM | POA: Diagnosis not present

## 2017-03-21 DIAGNOSIS — E119 Type 2 diabetes mellitus without complications: Secondary | ICD-10-CM | POA: Diagnosis not present

## 2017-03-21 DIAGNOSIS — M3501 Sicca syndrome with keratoconjunctivitis: Secondary | ICD-10-CM | POA: Diagnosis not present

## 2017-03-21 DIAGNOSIS — R1310 Dysphagia, unspecified: Secondary | ICD-10-CM | POA: Diagnosis not present

## 2017-03-21 DIAGNOSIS — I509 Heart failure, unspecified: Secondary | ICD-10-CM | POA: Diagnosis not present

## 2017-03-21 DIAGNOSIS — M160 Bilateral primary osteoarthritis of hip: Secondary | ICD-10-CM | POA: Diagnosis not present

## 2017-03-21 DIAGNOSIS — J449 Chronic obstructive pulmonary disease, unspecified: Secondary | ICD-10-CM | POA: Diagnosis not present
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
## 2017-03-27 ENCOUNTER — Other Ambulatory Visit: Payer: Self-pay | Admitting: Cardiology

## 2017-04-02 ENCOUNTER — Encounter (HOSPITAL_COMMUNITY): Payer: Self-pay | Admitting: Licensed Clinical Social Worker

## 2017-04-02 ENCOUNTER — Ambulatory Visit (INDEPENDENT_AMBULATORY_CARE_PROVIDER_SITE_OTHER): Payer: PPO | Admitting: Licensed Clinical Social Worker

## 2017-04-02 DIAGNOSIS — F411 Generalized anxiety disorder: Secondary | ICD-10-CM

## 2017-04-02 DIAGNOSIS — F331 Major depressive disorder, recurrent, moderate: Secondary | ICD-10-CM
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
## 2017-04-04 ENCOUNTER — Ambulatory Visit (INDEPENDENT_AMBULATORY_CARE_PROVIDER_SITE_OTHER): Payer: PPO | Admitting: Psychiatry

## 2017-04-04 ENCOUNTER — Encounter (HOSPITAL_COMMUNITY): Payer: Self-pay | Admitting: Psychiatry

## 2017-04-04 VITALS — BP 125/73 | HR 77 | Ht 64.0 in | Wt 156.4 lb

## 2017-04-04 DIAGNOSIS — I509 Heart failure, unspecified: Secondary | ICD-10-CM | POA: Diagnosis not present

## 2017-04-04 DIAGNOSIS — G4700 Insomnia, unspecified: Secondary | ICD-10-CM | POA: Diagnosis not present

## 2017-04-04 DIAGNOSIS — Z79891 Long term (current) use of opiate analgesic: Secondary | ICD-10-CM | POA: Diagnosis not present

## 2017-04-04 DIAGNOSIS — E785 Hyperlipidemia, unspecified: Secondary | ICD-10-CM

## 2017-04-04 DIAGNOSIS — F331 Major depressive disorder, recurrent, moderate: Secondary | ICD-10-CM

## 2017-04-04 DIAGNOSIS — F4310 Post-traumatic stress disorder, unspecified: Secondary | ICD-10-CM

## 2017-04-04 MED ORDER — QUETIAPINE FUMARATE 25 MG PO TABS: 25.0000 mg | ORAL_TABLET | Freq: Every day | ORAL | 1 refills | Status: DC

## 2017-04-04 MED ORDER — FLUOXETINE HCL 40 MG PO CAPS: 80.0000 mg | ORAL_CAPSULE | Freq: Every day | ORAL | 1 refills | Status: DC

## 2017-04-04 MED ORDER — LORAZEPAM 1 MG PO TABS
ORAL_TABLET | ORAL | 1 refills | Status: DC
Start: 2017-04-04 — End: 2017-06-05

## 2017-04-04 NOTE — Patient Instructions (Addendum)
1. Continue fluoxetine 80 mg daily 2. Continue quetiapine 25 mg at night 3. Continue lorazepam 0.5 mg twice a day and 1mg  at night as needed for anxiety 4. Return to clinic in two months 5. Try to do regular exercise- walking, strech

## 2017-04-25 ENCOUNTER — Ambulatory Visit (INDEPENDENT_AMBULATORY_CARE_PROVIDER_SITE_OTHER): Payer: PPO | Admitting: Licensed Clinical Social Worker

## 2017-04-25 DIAGNOSIS — F331 Major depressive disorder, recurrent, moderate: Secondary | ICD-10-CM

## 2017-04-26 ENCOUNTER — Encounter (HOSPITAL_COMMUNITY): Payer: Self-pay | Admitting: Licensed Clinical Social Worker
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
## 2017-05-07 DIAGNOSIS — F419 Anxiety disorder, unspecified: Secondary | ICD-10-CM | POA: Diagnosis not present

## 2017-05-07 DIAGNOSIS — M810 Age-related osteoporosis without current pathological fracture: Secondary | ICD-10-CM | POA: Diagnosis not present

## 2017-05-07 DIAGNOSIS — M3501 Sicca syndrome with keratoconjunctivitis: Secondary | ICD-10-CM | POA: Diagnosis not present

## 2017-05-07 DIAGNOSIS — Z7952 Long term (current) use of systemic steroids: Secondary | ICD-10-CM | POA: Diagnosis not present

## 2017-05-14 ENCOUNTER — Ambulatory Visit (HOSPITAL_COMMUNITY): Payer: PPO | Admitting: Licensed Clinical Social Worker

## 2017-05-15 ENCOUNTER — Telehealth (HOSPITAL_COMMUNITY): Payer: Self-pay | Admitting: *Deleted

## 2017-05-15 NOTE — Telephone Encounter (Signed)
Pt pharmacy [facility] in [place] faxed office refill request for pt Fluoxetine 40 mg 2 caps Daily. Per pt chart, pt medication was last filled on 04-04-2017 with 30 tabs 1 refill. Pt have f/u with Dr. Modesta Messing on 06-05-2017. Pt pharmacy fax number is [phone] and phone number is [phone].

## 2017-05-16 ENCOUNTER — Other Ambulatory Visit (HOSPITAL_COMMUNITY): Payer: Self-pay | Admitting: Psychiatry

## 2017-05-16 MED ORDER — FLUOXETINE HCL 40 MG PO CAPS: 80.0000 mg | ORAL_CAPSULE | Freq: Every day | ORAL | 0 refills | Status: DC

## 2017-05-16 NOTE — Telephone Encounter (Signed)
noted 

## 2017-05-16 NOTE — Telephone Encounter (Signed)
2 week supply sent in

## 2017-05-29 ENCOUNTER — Other Ambulatory Visit (HOSPITAL_COMMUNITY): Payer: Self-pay | Admitting: Psychiatry

## 2017-05-31 ENCOUNTER — Ambulatory Visit (INDEPENDENT_AMBULATORY_CARE_PROVIDER_SITE_OTHER): Payer: PPO | Admitting: Cardiology

## 2017-05-31 ENCOUNTER — Encounter: Payer: Self-pay | Admitting: *Deleted

## 2017-05-31 ENCOUNTER — Ambulatory Visit: Payer: Self-pay | Admitting: Cardiology

## 2017-05-31 VITALS — BP 96/66 | HR 76 | Ht 64.5 in | Wt 159.2 lb

## 2017-05-31 DIAGNOSIS — J449 Chronic obstructive pulmonary disease, unspecified: Secondary | ICD-10-CM

## 2017-05-31 DIAGNOSIS — E118 Type 2 diabetes mellitus with unspecified complications: Secondary | ICD-10-CM

## 2017-05-31 DIAGNOSIS — I5022 Chronic systolic (congestive) heart failure: Secondary | ICD-10-CM | POA: Diagnosis not present

## 2017-05-31 NOTE — Progress Notes (Signed)
Clinical Summary Kristin Crosby is a 66 y.o.female seen today for follow up of the following medical problems  1. Chronic Systolic heart failure - LVEF 40-45% by echo 02/2013, she is NYHA II.  - 02/2013 negative exercise stress MPI for ischemia  -medication titration has been limited due to soft bp's and some orthostatic dizziness. Taking torsemide only prn as needed, effective at controlling her LE edema.  - repeat echo 10/2016 LVEF 45%.    - still with some SOB at times. Mild improvement with inhalers - has had some recent weight gain.  - compliant with meds. Mild dizziness at times with standing.    2. COPD - former smoker x 30 years. Has intermittent episodes of SOB.  - mild obstruction on PFTs Jan 2017 - started on prn albuterol.   - SOB overall stable since last visit.   3. DM2 - from cardiac standpoint she is on ASA, statin, ACE-I   SH: works as Programmer, systems   Past Medical History:  Diagnosis Date  . Anemia   . CHF (congestive heart failure) ([place])    Reported remote negative pharmacologic nuclear imaging study and echocardiogram  . DM (diabetes mellitus) ([place])   . Fibromyalgia   . GERD (gastroesophageal reflux disease)   . History of tobacco use   . Low HDL (under 40)   . Panic disorder   . Sjogren's disease ([place])      Allergies  Allergen Reactions  . Codeine Camsylate [Codeine] Itching  . Nicotine      Current Outpatient Prescriptions  Medication Sig Dispense Refill  . albuterol (PROVENTIL HFA;VENTOLIN HFA) 108 (90 Base) MCG/ACT inhaler Inhale 2 puffs into the lungs every 6 (six) hours as needed for wheezing or shortness of breath. 1 Inhaler 2  . Ascorbic Acid (VITAMIN C) 1000 MG tablet Take 1,000 mg by mouth daily.    Marland Kitchen aspirin EC 81 MG tablet Take 81 mg by mouth daily.    . Calcium Carb-Cholecalciferol (CALCIUM + D3 PO) Take 1 tablet by mouth 2 (two) times daily. Reported on 09/20/2015    . carvedilol (COREG) 12.5 MG tablet TAKE ONE  TABLET BY MOUTH TWICE DAILY 180 tablet 0  . CRANBERRY PO Take 15,000 mg by mouth daily. Reported on 09/20/2015    . FLUoxetine (PROZAC) 40 MG capsule Take 2 capsules (80 mg total) by mouth daily. 30 capsule 0  . hydrOXYzine (ATARAX/VISTARIL) 25 MG tablet Take 25 mg by mouth as needed.     Marland Kitchen ibuprofen (ADVIL,MOTRIN) 200 MG tablet Take 200 mg by mouth 4 (four) times daily as needed for pain.    . IRON PO Take by mouth daily.    Marland Kitchen KLOR-CON M20 20 MEQ tablet TAKE ONE TABLET BY MOUTH ONCE DAILY AS NEEDED ONLY ON THE DAYS YOU TAKE TORSEMIDE 30 tablet 4  . lisinopril (PRINIVIL,ZESTRIL) 5 MG tablet TAKE ONE TABLET BY MOUTH ONCE DAILY 90 tablet 0  . LORazepam (ATIVAN) 1 MG tablet 0.5 mg twice a day and 1 mg at night as needed for anxiety 75 tablet 1  . meclizine (ANTIVERT) 25 MG tablet Take 25 mg by mouth 3 (three) times daily as needed for dizziness (vertigo).    . metFORMIN (GLUCOPHAGE) 500 MG tablet Take 500 mg by mouth 2 (two) times daily with a meal.     . Multiple Vitamin (MULTIVITAMIN) tablet Take 1 tablet by mouth daily. Reported on 09/20/2015    . nitroGLYCERIN (NITROSTAT) 0.4 MG SL tablet Place 0.4  mg under the tongue every 5 (five) minutes as needed for chest pain.    . predniSONE (DELTASONE) 5 MG tablet Take 5 mg by mouth daily.     . QUEtiapine (SEROQUEL) 25 MG tablet Take 1 tablet (25 mg total) by mouth at bedtime. 30 tablet 1  . ranitidine (ZANTAC) 150 MG tablet Take 150 mg by mouth 2 (two) times daily.    . simvastatin (ZOCOR) 20 MG tablet TAKE ONE TABLET BY MOUTH ONCE DAILY 90 tablet 0  . torsemide (DEMADEX) 20 MG tablet Take 1 tablet (20 mg total) by mouth every other day. (Patient taking differently: Take 20 mg by mouth as needed. ) 30 tablet 6   No current facility-administered medications for this visit.      No past surgical history on file.   Allergies  Allergen Reactions  . Codeine Camsylate [Codeine] Itching  . Nicotine       Family History  Problem Relation Age of  Onset  . Heart failure Mother   . Heart disease Father        CABG in 65'S     Social History Ms. Clabo reports that she quit smoking about 5 years ago. Her smoking use included Cigarettes. She started smoking about 52 years ago. She has a 55.50 pack-year smoking history. She has never used smokeless tobacco. Ms. Nudo reports that she does not drink alcohol.   Review of Systems CONSTITUTIONAL: No weight loss, fever, chills, weakness or fatigue.  HEENT: Eyes: No visual loss, blurred vision, double vision or yellow sclerae.No hearing loss, sneezing, congestion, runny nose or sore throat.  SKIN: No rash or itching.  CARDIOVASCULAR: per hpi RESPIRATORY: per hpi GASTROINTESTINAL: No anorexia, nausea, vomiting or diarrhea. No abdominal pain or blood.  GENITOURINARY: No burning on urination, no polyuria NEUROLOGICAL: No headache, dizziness, syncope, paralysis, ataxia, numbness or tingling in the extremities. No change in bowel or bladder control.  MUSCULOSKELETAL: No muscle, back pain, joint pain or stiffness.  LYMPHATICS: No enlarged nodes. No history of splenectomy.  PSYCHIATRIC: No history of depression or anxiety.  ENDOCRINOLOGIC: No reports of sweating, cold or heat intolerance. No polyuria or polydipsia.  Marland Kitchen   Physical Examination Vitals:   05/31/17 1043  BP: 96/66  Pulse: 76   Vitals:   05/31/17 1043  Weight: 159 lb 3.2 oz (72.2 kg)  Height: 5' 4.5" (1.638 m)    Gen: resting comfortably, no acute distress HEENT: no scleral icterus, pupils equal round and reactive, no palptable cervical adenopathy,  CV: RRR, no m/r/g, no jvd Resp: Clear to auscultation bilaterally GI: abdomen is soft, non-tender, non-distended, normal bowel sounds, no hepatosplenomegaly MSK: extremities are warm, no edema.  Skin: warm, no rash Neuro:  no focal deficits Psych: appropriate affect   Diagnostic Studies 02/2013 Exercise MPI: exc 3 min, 7 METs, 93% THR, LVEF 48%, small fixed apical  defect due to breast attenuation.   02/2013 Echo: LVEF 27-06%, Grade I diastolic dysfunction, hypokinetic apical septal wall. Akinetic basal anterolateral and basal anterior walls. Mild MR   Jan 2017 PFTs: mild obstruction  10/2016 echo Study Conclusions  - Left ventricle: The cavity size was normal. Wall thickness was   increased in a pattern of mild LVH. The estimated ejection   fraction was 45%. There is hypokinesis of the anteroseptal   myocardium. There is akinesis of the basalinferior myocardium.   Doppler parameters are consistent with abnormal left ventricular   relaxation (grade 1 diastolic dysfunction). - Mitral valve: There was trivial  regurgitation. - Right atrium: Central venous pressure (est): 3 mm Hg. - Tricuspid valve: There was trivial regurgitation. - Pulmonary arteries: PA peak pressure: 16 mm Hg (S). - Pericardium, extracardiac: A small pericardial effusion was   identified.  Impressions:  - Mild LVH with LVEF approximately 45%. There is hypokinesis of the   anteroseptal wall and akinesis of the basal inferior wall. Grade   1 diastolic dysfunction. Trivial mitral and tricuspid   regurgitation. Small pericardial effusion noted.  Assessment and Plan   1. Chronic systolic heart failure - mild LV systolic dysfunction by last echo. Overall stable mild symptoms - medical therapy limited by soft bp's and orthostatic symptoms  2. COPD - continue inhalers  3. DM2 - continue cardiovascular protection with ASA, ACE-I, statin   Arnoldo Lenis, M.D.

## 2017-05-31 NOTE — Patient Instructions (Signed)
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
## 2017-06-05 ENCOUNTER — Encounter (HOSPITAL_COMMUNITY): Payer: Self-pay | Admitting: Psychiatry

## 2017-06-05 ENCOUNTER — Ambulatory Visit (INDEPENDENT_AMBULATORY_CARE_PROVIDER_SITE_OTHER): Payer: PPO | Admitting: Psychiatry

## 2017-06-05 VITALS — BP 110/64 | HR 86 | Ht 64.5 in | Wt 155.6 lb

## 2017-06-05 DIAGNOSIS — F419 Anxiety disorder, unspecified: Secondary | ICD-10-CM

## 2017-06-05 DIAGNOSIS — Z87891 Personal history of nicotine dependence: Secondary | ICD-10-CM | POA: Diagnosis not present

## 2017-06-05 DIAGNOSIS — F4310 Post-traumatic stress disorder, unspecified: Secondary | ICD-10-CM | POA: Diagnosis not present

## 2017-06-05 DIAGNOSIS — Z79899 Other long term (current) drug therapy: Secondary | ICD-10-CM | POA: Diagnosis not present

## 2017-06-05 DIAGNOSIS — G4700 Insomnia, unspecified: Secondary | ICD-10-CM

## 2017-06-05 DIAGNOSIS — F331 Major depressive disorder, recurrent, moderate: Secondary | ICD-10-CM | POA: Diagnosis not present

## 2017-06-05 DIAGNOSIS — Z630 Problems in relationship with spouse or partner: Secondary | ICD-10-CM

## 2017-06-05 MED ORDER — FLUOXETINE HCL 40 MG PO CAPS: 80.0000 mg | ORAL_CAPSULE | Freq: Every day | ORAL | 1 refills | Status: DC

## 2017-06-05 MED ORDER — LORAZEPAM 1 MG PO TABS: ORAL_TABLET | ORAL | 1 refills | Status: DC

## 2017-06-05 MED ORDER — LORAZEPAM 1 MG PO TABS: 1.0000 mg | ORAL_TABLET | Freq: Two times a day (BID) | ORAL | 1 refills | Status: DC

## 2017-06-05 MED ORDER — QUETIAPINE FUMARATE 25 MG PO TABS: 25.0000 mg | ORAL_TABLET | Freq: Every day | ORAL | 1 refills | Status: DC

## 2017-06-05 NOTE — Patient Instructions (Signed)
1. Continue fluoxetine 80 mg daily 2. Continue quetiapine 25 mg at night 3. Continue lorazepam 0.5 mg twice a day and 1 mg at night as needed for anxiety 4. Return to clinic in one month

## 2017-06-06 ENCOUNTER — Telehealth (HOSPITAL_COMMUNITY): Payer: Self-pay

## 2017-06-06 ENCOUNTER — Other Ambulatory Visit (HOSPITAL_COMMUNITY): Payer: Self-pay | Admitting: Psychiatry

## 2017-06-06 MED ORDER — FLUOXETINE HCL 40 MG PO CAPS: 80.0000 mg | ORAL_CAPSULE | Freq: Every day | ORAL | 1 refills | Status: DC

## 2017-06-06 NOTE — Telephone Encounter (Signed)
Sorry for the trouble. I resent order for # 60 tabs.

## 2017-06-06 NOTE — Telephone Encounter (Signed)
Medication problem - Patient called stating she was seen 06/05/17 and given only #30 tablets for Fluoxetine 40 mg but is supposed to take it twice a day. Requests Dr. Modesta Messing send a new order with corrected dosage so she can pick up from her pharmacy.  Agreed to send message to Dr. Modesta Messing

## 2017-06-10 DIAGNOSIS — L821 Other seborrheic keratosis: Secondary | ICD-10-CM | POA: Diagnosis not present

## 2017-06-10 DIAGNOSIS — Z6826 Body mass index (BMI) 26.0-26.9, adult: Secondary | ICD-10-CM | POA: Diagnosis not present

## 2017-06-10 DIAGNOSIS — R0600 Dyspnea, unspecified: Secondary | ICD-10-CM | POA: Diagnosis not present

## 2017-06-10 DIAGNOSIS — J449 Chronic obstructive pulmonary disease, unspecified: Secondary | ICD-10-CM | POA: Diagnosis not present

## 2017-06-10 DIAGNOSIS — I509 Heart failure, unspecified: Secondary | ICD-10-CM | POA: Diagnosis not present

## 2017-06-10 DIAGNOSIS — M160 Bilateral primary osteoarthritis of hip: Secondary | ICD-10-CM | POA: Diagnosis not present

## 2017-06-18 ENCOUNTER — Other Ambulatory Visit: Payer: Self-pay | Admitting: Cardiology

## 2017-06-22 ENCOUNTER — Telehealth (HOSPITAL_COMMUNITY): Payer: Self-pay | Admitting: *Deleted

## 2017-06-22 NOTE — Telephone Encounter (Signed)
Office received refill request from [facility] in [place] requesting pt Ativan. Per pt chart, pt medication was last filled on 06-05-2017 with 60 tabs 1 refill. Pt f/u appt is 08-07-2017.

## 2017-06-23 NOTE — Telephone Encounter (Signed)
She has a refill

## 2017-06-24 NOTE — Telephone Encounter (Signed)
noted 

## 2017-06-25 ENCOUNTER — Other Ambulatory Visit: Payer: Self-pay | Admitting: Cardiology

## 2017-06-26 ENCOUNTER — Ambulatory Visit (INDEPENDENT_AMBULATORY_CARE_PROVIDER_SITE_OTHER): Payer: PPO | Admitting: Licensed Clinical Social Worker

## 2017-06-26 DIAGNOSIS — F411 Generalized anxiety disorder: Secondary | ICD-10-CM | POA: Diagnosis not present

## 2017-06-26 DIAGNOSIS — F331 Major depressive disorder, recurrent, moderate: Secondary | ICD-10-CM
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
## 2017-07-12 DIAGNOSIS — E119 Type 2 diabetes mellitus without complications: Secondary | ICD-10-CM | POA: Diagnosis not present

## 2017-07-12 DIAGNOSIS — Z6826 Body mass index (BMI) 26.0-26.9, adult: Secondary | ICD-10-CM | POA: Diagnosis not present

## 2017-07-12 DIAGNOSIS — J441 Chronic obstructive pulmonary disease with (acute) exacerbation: Secondary | ICD-10-CM | POA: Diagnosis not present

## 2017-07-17 ENCOUNTER — Ambulatory Visit (INDEPENDENT_AMBULATORY_CARE_PROVIDER_SITE_OTHER): Payer: PPO | Admitting: Licensed Clinical Social Worker

## 2017-07-17 DIAGNOSIS — F331 Major depressive disorder, recurrent, moderate: Secondary | ICD-10-CM | POA: Diagnosis not present
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
## 2017-07-24 DIAGNOSIS — M810 Age-related osteoporosis without current pathological fracture: Secondary | ICD-10-CM | POA: Diagnosis not present

## 2017-07-24 DIAGNOSIS — Z23 Encounter for immunization: Secondary | ICD-10-CM | POA: Diagnosis not present

## 2017-07-24 DIAGNOSIS — M3501 Sicca syndrome with keratoconjunctivitis: Secondary | ICD-10-CM | POA: Diagnosis not present

## 2017-07-24 DIAGNOSIS — R0600 Dyspnea, unspecified: Secondary | ICD-10-CM | POA: Diagnosis not present

## 2017-07-24 DIAGNOSIS — J449 Chronic obstructive pulmonary disease, unspecified: Secondary | ICD-10-CM | POA: Diagnosis not present

## 2017-07-24 DIAGNOSIS — E119 Type 2 diabetes mellitus without complications: Secondary | ICD-10-CM | POA: Diagnosis not present

## 2017-07-24 DIAGNOSIS — Z6825 Body mass index (BMI) 25.0-25.9, adult: Secondary | ICD-10-CM | POA: Diagnosis not present

## 2017-07-24 DIAGNOSIS — I509 Heart failure, unspecified: Secondary | ICD-10-CM | POA: Diagnosis not present
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
## 2017-08-07 ENCOUNTER — Ambulatory Visit (HOSPITAL_COMMUNITY): Payer: PPO | Admitting: Psychiatry

## 2017-08-13 ENCOUNTER — Ambulatory Visit (HOSPITAL_COMMUNITY): Payer: Self-pay | Admitting: Licensed Clinical Social Worker

## 2017-08-21 ENCOUNTER — Other Ambulatory Visit (HOSPITAL_COMMUNITY): Payer: Self-pay | Admitting: Psychiatry

## 2017-08-21 MED ORDER — LORAZEPAM 1 MG PO TABS: 1.0000 mg | ORAL_TABLET | Freq: Two times a day (BID) | ORAL | 0 refills | Status: DC

## 2017-08-21 NOTE — Telephone Encounter (Signed)
Per request, ordered ativan 16 tabs to last until the next appointment.

## 2017-08-28 ENCOUNTER — Ambulatory Visit (HOSPITAL_COMMUNITY): Payer: PPO | Admitting: Psychiatry

## 2017-09-18 ENCOUNTER — Encounter: Payer: Self-pay | Admitting: Cardiology

## 2017-09-18 DIAGNOSIS — J441 Chronic obstructive pulmonary disease with (acute) exacerbation: Secondary | ICD-10-CM | POA: Diagnosis not present

## 2017-09-18 DIAGNOSIS — E119 Type 2 diabetes mellitus without complications: Secondary | ICD-10-CM | POA: Diagnosis not present

## 2017-09-18 DIAGNOSIS — I509 Heart failure, unspecified: Secondary | ICD-10-CM | POA: Diagnosis not present

## 2017-09-18 DIAGNOSIS — F3341 Major depressive disorder, recurrent, in partial remission: Secondary | ICD-10-CM | POA: Diagnosis not present

## 2017-09-20 DIAGNOSIS — J449 Chronic obstructive pulmonary disease, unspecified: Secondary | ICD-10-CM | POA: Diagnosis not present

## 2017-09-20 DIAGNOSIS — M3501 Sicca syndrome with keratoconjunctivitis: Secondary | ICD-10-CM | POA: Diagnosis not present

## 2017-09-20 DIAGNOSIS — F411 Generalized anxiety disorder: Secondary | ICD-10-CM | POA: Diagnosis not present

## 2017-09-20 DIAGNOSIS — Z6825 Body mass index (BMI) 25.0-25.9, adult: Secondary | ICD-10-CM | POA: Diagnosis not present

## 2017-09-20 DIAGNOSIS — F429 Obsessive-compulsive disorder, unspecified: Secondary | ICD-10-CM | POA: Diagnosis not present

## 2017-09-20 DIAGNOSIS — E119 Type 2 diabetes mellitus without complications: Secondary | ICD-10-CM | POA: Diagnosis not present

## 2017-09-20 DIAGNOSIS — F3341 Major depressive disorder, recurrent, in partial remission: Secondary | ICD-10-CM | POA: Diagnosis not present

## 2017-09-20 DIAGNOSIS — F4312 Post-traumatic stress disorder, chronic: Secondary | ICD-10-CM | POA: Diagnosis not present

## 2017-09-24 ENCOUNTER — Other Ambulatory Visit: Payer: Self-pay | Admitting: Cardiology

## 2017-10-02 ENCOUNTER — Ambulatory Visit: Payer: Self-pay | Admitting: Cardiology

## 2017-10-10 DIAGNOSIS — D649 Anemia, unspecified: Secondary | ICD-10-CM | POA: Diagnosis not present

## 2017-10-10 DIAGNOSIS — M3501 Sicca syndrome with keratoconjunctivitis: Secondary | ICD-10-CM | POA: Diagnosis not present

## 2017-10-10 DIAGNOSIS — Z7952 Long term (current) use of systemic steroids: Secondary | ICD-10-CM | POA: Diagnosis not present

## 2017-10-10 DIAGNOSIS — F419 Anxiety disorder, unspecified: Secondary | ICD-10-CM | POA: Diagnosis not present

## 2017-10-10 DIAGNOSIS — M329 Systemic lupus erythematosus, unspecified: Secondary | ICD-10-CM | POA: Diagnosis not present

## 2017-10-10 DIAGNOSIS — M810 Age-related osteoporosis without current pathological fracture: Secondary | ICD-10-CM | POA: Diagnosis not present

## 2017-10-25 ENCOUNTER — Encounter: Payer: Self-pay | Admitting: Cardiology

## 2017-10-25 ENCOUNTER — Ambulatory Visit (INDEPENDENT_AMBULATORY_CARE_PROVIDER_SITE_OTHER): Payer: PPO | Admitting: Cardiology

## 2017-10-25 ENCOUNTER — Encounter: Payer: Self-pay | Admitting: *Deleted

## 2017-10-25 VITALS — BP 102/70 | HR 86 | Ht 65.0 in | Wt 153.6 lb

## 2017-10-25 DIAGNOSIS — I5022 Chronic systolic (congestive) heart failure: Secondary | ICD-10-CM | POA: Diagnosis not present

## 2017-10-25 NOTE — Patient Instructions (Addendum)
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
## 2017-10-30 ENCOUNTER — Encounter: Payer: Self-pay | Admitting: Cardiology

## 2017-11-20 ENCOUNTER — Other Ambulatory Visit: Payer: Self-pay | Admitting: Cardiology

## 2017-12-10 ENCOUNTER — Other Ambulatory Visit: Payer: Self-pay | Admitting: Cardiology

## 2017-12-18 ENCOUNTER — Other Ambulatory Visit: Payer: Self-pay | Admitting: Cardiology

## 2017-12-24 DIAGNOSIS — M3501 Sicca syndrome with keratoconjunctivitis: Secondary | ICD-10-CM | POA: Diagnosis not present

## 2017-12-24 DIAGNOSIS — F429 Obsessive-compulsive disorder, unspecified: Secondary | ICD-10-CM | POA: Diagnosis not present

## 2017-12-24 DIAGNOSIS — Z6825 Body mass index (BMI) 25.0-25.9, adult: Secondary | ICD-10-CM | POA: Diagnosis not present

## 2017-12-24 DIAGNOSIS — F4312 Post-traumatic stress disorder, chronic: Secondary | ICD-10-CM | POA: Diagnosis not present

## 2017-12-24 DIAGNOSIS — F411 Generalized anxiety disorder: Secondary | ICD-10-CM | POA: Diagnosis not present

## 2017-12-24 DIAGNOSIS — E119 Type 2 diabetes mellitus without complications: Secondary | ICD-10-CM | POA: Diagnosis not present

## 2017-12-24 DIAGNOSIS — J449 Chronic obstructive pulmonary disease, unspecified: Secondary | ICD-10-CM | POA: Diagnosis not present

## 2017-12-24 DIAGNOSIS — F3341 Major depressive disorder, recurrent, in partial remission: Secondary | ICD-10-CM | POA: Diagnosis not present

## 2017-12-26 DIAGNOSIS — H524 Presbyopia: Secondary | ICD-10-CM | POA: Diagnosis not present

## 2018-01-02 DIAGNOSIS — H2513 Age-related nuclear cataract, bilateral: Secondary | ICD-10-CM | POA: Diagnosis not present

## 2018-01-02 DIAGNOSIS — E119 Type 2 diabetes mellitus without complications: Secondary | ICD-10-CM | POA: Diagnosis not present

## 2018-01-02 DIAGNOSIS — H3509 Other intraretinal microvascular abnormalities: Secondary | ICD-10-CM | POA: Diagnosis not present

## 2018-01-02 DIAGNOSIS — H47393 Other disorders of optic disc, bilateral: Secondary | ICD-10-CM | POA: Diagnosis not present

## 2018-01-09 DIAGNOSIS — H47321 Drusen of optic disc, right eye: Secondary | ICD-10-CM | POA: Diagnosis not present

## 2018-01-09 DIAGNOSIS — H47393 Other disorders of optic disc, bilateral: Secondary | ICD-10-CM | POA: Diagnosis not present

## 2018-01-23 DIAGNOSIS — Z6826 Body mass index (BMI) 26.0-26.9, adult: Secondary | ICD-10-CM | POA: Diagnosis not present

## 2018-01-23 DIAGNOSIS — J0100 Acute maxillary sinusitis, unspecified: Secondary | ICD-10-CM | POA: Diagnosis not present

## 2018-02-21 DIAGNOSIS — N952 Postmenopausal atrophic vaginitis: Secondary | ICD-10-CM | POA: Diagnosis not present

## 2018-02-21 DIAGNOSIS — I5032 Chronic diastolic (congestive) heart failure: Secondary | ICD-10-CM | POA: Insufficient documentation

## 2018-02-21 DIAGNOSIS — N898 Other specified noninflammatory disorders of vagina: Secondary | ICD-10-CM | POA: Diagnosis not present

## 2018-03-28 DIAGNOSIS — D509 Iron deficiency anemia, unspecified: Secondary | ICD-10-CM | POA: Diagnosis not present

## 2018-03-28 DIAGNOSIS — J449 Chronic obstructive pulmonary disease, unspecified: Secondary | ICD-10-CM | POA: Diagnosis not present

## 2018-03-28 DIAGNOSIS — J441 Chronic obstructive pulmonary disease with (acute) exacerbation: Secondary | ICD-10-CM | POA: Diagnosis not present

## 2018-03-28 DIAGNOSIS — E119 Type 2 diabetes mellitus without complications: Secondary | ICD-10-CM | POA: Diagnosis not present

## 2018-03-28 DIAGNOSIS — M545 Low back pain: Secondary | ICD-10-CM | POA: Diagnosis not present

## 2018-04-03 DIAGNOSIS — J449 Chronic obstructive pulmonary disease, unspecified: Secondary | ICD-10-CM | POA: Diagnosis not present

## 2018-04-03 DIAGNOSIS — E119 Type 2 diabetes mellitus without complications: Secondary | ICD-10-CM | POA: Diagnosis not present

## 2018-04-03 DIAGNOSIS — Z0000 Encounter for general adult medical examination without abnormal findings: Secondary | ICD-10-CM | POA: Diagnosis not present

## 2018-04-03 DIAGNOSIS — I509 Heart failure, unspecified: Secondary | ICD-10-CM | POA: Diagnosis not present

## 2018-04-03 DIAGNOSIS — F429 Obsessive-compulsive disorder, unspecified: Secondary | ICD-10-CM | POA: Diagnosis not present

## 2018-04-03 DIAGNOSIS — Z6825 Body mass index (BMI) 25.0-25.9, adult: Secondary | ICD-10-CM | POA: Diagnosis not present

## 2018-04-03 DIAGNOSIS — F4312 Post-traumatic stress disorder, chronic: Secondary | ICD-10-CM | POA: Diagnosis not present

## 2018-04-03 DIAGNOSIS — M810 Age-related osteoporosis without current pathological fracture: Secondary | ICD-10-CM | POA: Diagnosis not present

## 2018-04-25 DIAGNOSIS — M7551 Bursitis of right shoulder: Secondary | ICD-10-CM | POA: Diagnosis not present

## 2018-04-25 DIAGNOSIS — M810 Age-related osteoporosis without current pathological fracture: Secondary | ICD-10-CM | POA: Diagnosis not present

## 2018-04-25 DIAGNOSIS — Z7952 Long term (current) use of systemic steroids: Secondary | ICD-10-CM | POA: Diagnosis not present

## 2018-04-25 DIAGNOSIS — F419 Anxiety disorder, unspecified: Secondary | ICD-10-CM | POA: Diagnosis not present

## 2018-04-25 DIAGNOSIS — D649 Anemia, unspecified: Secondary | ICD-10-CM | POA: Diagnosis not present

## 2018-04-25 DIAGNOSIS — M329 Systemic lupus erythematosus, unspecified: Secondary | ICD-10-CM | POA: Diagnosis not present

## 2018-04-25 DIAGNOSIS — M3501 Sicca syndrome with keratoconjunctivitis: Secondary | ICD-10-CM | POA: Diagnosis not present

## 2018-04-29 ENCOUNTER — Ambulatory Visit (INDEPENDENT_AMBULATORY_CARE_PROVIDER_SITE_OTHER): Payer: PPO | Admitting: Cardiology

## 2018-04-29 ENCOUNTER — Encounter: Payer: Self-pay | Admitting: *Deleted

## 2018-04-29 ENCOUNTER — Encounter: Payer: Self-pay | Admitting: Cardiology

## 2018-04-29 ENCOUNTER — Other Ambulatory Visit: Payer: Self-pay

## 2018-04-29 VITALS — BP 124/70 | HR 79 | Ht 65.0 in | Wt 165.0 lb

## 2018-04-29 DIAGNOSIS — I5022 Chronic systolic (congestive) heart failure: Secondary | ICD-10-CM
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
## 2018-04-29 NOTE — Patient Instructions (Signed)

## 2018-04-30 ENCOUNTER — Other Ambulatory Visit: Payer: Self-pay | Admitting: Cardiology

## 2018-05-01 DIAGNOSIS — M85852 Other specified disorders of bone density and structure, left thigh: Secondary | ICD-10-CM | POA: Diagnosis not present

## 2018-05-01 DIAGNOSIS — M810 Age-related osteoporosis without current pathological fracture: Secondary | ICD-10-CM | POA: Diagnosis not present

## 2018-05-30 DIAGNOSIS — Z6827 Body mass index (BMI) 27.0-27.9, adult: Secondary | ICD-10-CM | POA: Diagnosis not present

## 2018-05-30 DIAGNOSIS — B029 Zoster without complications: Secondary | ICD-10-CM | POA: Diagnosis not present

## 2018-06-10 ENCOUNTER — Other Ambulatory Visit: Payer: Self-pay | Admitting: Cardiology

## 2018-06-18 ENCOUNTER — Other Ambulatory Visit: Payer: Self-pay | Admitting: Cardiology

## 2018-06-26 DIAGNOSIS — J441 Chronic obstructive pulmonary disease with (acute) exacerbation: Secondary | ICD-10-CM | POA: Diagnosis not present

## 2018-06-26 DIAGNOSIS — I509 Heart failure, unspecified: Secondary | ICD-10-CM | POA: Diagnosis not present

## 2018-06-26 DIAGNOSIS — E119 Type 2 diabetes mellitus without complications: Secondary | ICD-10-CM | POA: Diagnosis not present

## 2018-07-03 DIAGNOSIS — F429 Obsessive-compulsive disorder, unspecified: Secondary | ICD-10-CM | POA: Diagnosis not present

## 2018-07-03 DIAGNOSIS — E119 Type 2 diabetes mellitus without complications: Secondary | ICD-10-CM | POA: Diagnosis not present

## 2018-07-03 DIAGNOSIS — Z23 Encounter for immunization: Secondary | ICD-10-CM | POA: Diagnosis not present

## 2018-07-03 DIAGNOSIS — Z6827 Body mass index (BMI) 27.0-27.9, adult: Secondary | ICD-10-CM | POA: Diagnosis not present

## 2018-07-03 DIAGNOSIS — J449 Chronic obstructive pulmonary disease, unspecified: Secondary | ICD-10-CM | POA: Diagnosis not present

## 2018-07-03 DIAGNOSIS — F4312 Post-traumatic stress disorder, chronic: Secondary | ICD-10-CM | POA: Diagnosis not present

## 2018-07-03 DIAGNOSIS — M3501 Sicca syndrome with keratoconjunctivitis: Secondary | ICD-10-CM | POA: Diagnosis not present

## 2018-08-16 DIAGNOSIS — E119 Type 2 diabetes mellitus without complications: Secondary | ICD-10-CM | POA: Diagnosis not present

## 2018-08-16 DIAGNOSIS — F3341 Major depressive disorder, recurrent, in partial remission: Secondary | ICD-10-CM | POA: Diagnosis not present

## 2018-08-16 DIAGNOSIS — D509 Iron deficiency anemia, unspecified: Secondary | ICD-10-CM | POA: Diagnosis not present

## 2018-09-15 DIAGNOSIS — J441 Chronic obstructive pulmonary disease with (acute) exacerbation: Secondary | ICD-10-CM | POA: Diagnosis not present

## 2018-09-15 DIAGNOSIS — Z6827 Body mass index (BMI) 27.0-27.9, adult: Secondary | ICD-10-CM | POA: Diagnosis not present

## 2018-09-15 DIAGNOSIS — J0100 Acute maxillary sinusitis, unspecified: Secondary | ICD-10-CM | POA: Diagnosis not present

## 2018-09-18 DIAGNOSIS — M329 Systemic lupus erythematosus, unspecified: Secondary | ICD-10-CM | POA: Diagnosis not present

## 2018-09-18 DIAGNOSIS — M810 Age-related osteoporosis without current pathological fracture: Secondary | ICD-10-CM | POA: Diagnosis not present

## 2018-09-18 DIAGNOSIS — D649 Anemia, unspecified: Secondary | ICD-10-CM | POA: Diagnosis not present

## 2018-09-18 DIAGNOSIS — F419 Anxiety disorder, unspecified: Secondary | ICD-10-CM | POA: Diagnosis not present

## 2018-09-18 DIAGNOSIS — M3501 Sicca syndrome with keratoconjunctivitis: Secondary | ICD-10-CM | POA: Diagnosis not present

## 2018-09-18 DIAGNOSIS — M7551 Bursitis of right shoulder: Secondary | ICD-10-CM | POA: Diagnosis not present

## 2018-09-18 DIAGNOSIS — Z7952 Long term (current) use of systemic steroids: Secondary | ICD-10-CM | POA: Diagnosis not present

## 2018-09-19 ENCOUNTER — Other Ambulatory Visit: Payer: Self-pay | Admitting: Cardiology

## 2018-10-02 ENCOUNTER — Encounter: Payer: Self-pay | Admitting: Cardiology

## 2018-10-02 DIAGNOSIS — J449 Chronic obstructive pulmonary disease, unspecified: Secondary | ICD-10-CM | POA: Diagnosis not present

## 2018-10-02 DIAGNOSIS — D509 Iron deficiency anemia, unspecified: Secondary | ICD-10-CM | POA: Diagnosis not present

## 2018-10-02 DIAGNOSIS — E119 Type 2 diabetes mellitus without complications: Secondary | ICD-10-CM | POA: Diagnosis not present

## 2018-10-02 DIAGNOSIS — F3341 Major depressive disorder, recurrent, in partial remission: Secondary | ICD-10-CM | POA: Diagnosis not present

## 2018-10-10 DIAGNOSIS — F4312 Post-traumatic stress disorder, chronic: Secondary | ICD-10-CM | POA: Diagnosis not present

## 2018-10-10 DIAGNOSIS — J449 Chronic obstructive pulmonary disease, unspecified: Secondary | ICD-10-CM | POA: Diagnosis not present

## 2018-10-10 DIAGNOSIS — Z6826 Body mass index (BMI) 26.0-26.9, adult: Secondary | ICD-10-CM | POA: Diagnosis not present

## 2018-10-10 DIAGNOSIS — F429 Obsessive-compulsive disorder, unspecified: Secondary | ICD-10-CM | POA: Diagnosis not present

## 2018-10-10 DIAGNOSIS — F3341 Major depressive disorder, recurrent, in partial remission: Secondary | ICD-10-CM | POA: Diagnosis not present

## 2018-10-10 DIAGNOSIS — E119 Type 2 diabetes mellitus without complications: Secondary | ICD-10-CM | POA: Diagnosis not present

## 2018-10-10 DIAGNOSIS — M3501 Sicca syndrome with keratoconjunctivitis: Secondary | ICD-10-CM | POA: Diagnosis not present

## 2018-10-23 DIAGNOSIS — Z1231 Encounter for screening mammogram for malignant neoplasm of breast: Secondary | ICD-10-CM | POA: Diagnosis not present

## 2018-11-14 ENCOUNTER — Encounter: Payer: Self-pay | Admitting: *Deleted

## 2018-11-14 ENCOUNTER — Ambulatory Visit (INDEPENDENT_AMBULATORY_CARE_PROVIDER_SITE_OTHER): Payer: PPO | Admitting: Cardiology

## 2018-11-14 ENCOUNTER — Encounter: Payer: Self-pay | Admitting: Cardiology

## 2018-11-14 VITALS — BP 116/70 | HR 89 | Ht 64.0 in | Wt 169.0 lb

## 2018-11-14 DIAGNOSIS — I5022 Chronic systolic (congestive) heart failure: Secondary | ICD-10-CM | POA: Diagnosis not present
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
## 2018-11-14 NOTE — Patient Instructions (Signed)

## 2018-11-24 DIAGNOSIS — G510 Bell's palsy: Secondary | ICD-10-CM | POA: Diagnosis not present

## 2018-11-24 DIAGNOSIS — R29810 Facial weakness: Secondary | ICD-10-CM | POA: Diagnosis not present

## 2018-11-24 DIAGNOSIS — Z7952 Long term (current) use of systemic steroids: Secondary | ICD-10-CM | POA: Diagnosis not present

## 2018-11-24 DIAGNOSIS — E785 Hyperlipidemia, unspecified: Secondary | ICD-10-CM | POA: Diagnosis not present

## 2018-11-24 DIAGNOSIS — Z7982 Long term (current) use of aspirin: Secondary | ICD-10-CM | POA: Diagnosis not present

## 2018-11-24 DIAGNOSIS — Z87891 Personal history of nicotine dependence: Secondary | ICD-10-CM | POA: Diagnosis not present

## 2018-11-24 DIAGNOSIS — Z79899 Other long term (current) drug therapy: Secondary | ICD-10-CM | POA: Diagnosis not present

## 2018-11-24 DIAGNOSIS — R0981 Nasal congestion: Secondary | ICD-10-CM | POA: Diagnosis not present

## 2018-11-24 DIAGNOSIS — F419 Anxiety disorder, unspecified: Secondary | ICD-10-CM | POA: Diagnosis not present

## 2018-11-24 DIAGNOSIS — E119 Type 2 diabetes mellitus without complications: Secondary | ICD-10-CM | POA: Diagnosis not present

## 2018-11-24 DIAGNOSIS — I509 Heart failure, unspecified: Secondary | ICD-10-CM | POA: Diagnosis not present

## 2018-11-24 DIAGNOSIS — Z7984 Long term (current) use of oral hypoglycemic drugs: Secondary | ICD-10-CM | POA: Diagnosis not present

## 2018-11-24 DIAGNOSIS — F329 Major depressive disorder, single episode, unspecified: Secondary | ICD-10-CM | POA: Diagnosis not present

## 2018-11-26 DIAGNOSIS — R928 Other abnormal and inconclusive findings on diagnostic imaging of breast: Secondary | ICD-10-CM | POA: Diagnosis not present

## 2018-11-26 DIAGNOSIS — N6320 Unspecified lump in the left breast, unspecified quadrant: Secondary | ICD-10-CM | POA: Diagnosis not present

## 2018-11-26 DIAGNOSIS — N6321 Unspecified lump in the left breast, upper outer quadrant: Secondary | ICD-10-CM | POA: Diagnosis not present

## 2018-11-28 DIAGNOSIS — M3501 Sicca syndrome with keratoconjunctivitis: Secondary | ICD-10-CM | POA: Diagnosis not present

## 2018-11-28 DIAGNOSIS — G510 Bell's palsy: Secondary | ICD-10-CM | POA: Diagnosis not present

## 2018-11-28 DIAGNOSIS — Z6827 Body mass index (BMI) 27.0-27.9, adult: Secondary | ICD-10-CM | POA: Diagnosis not present

## 2018-12-10 DIAGNOSIS — D479 Neoplasm of uncertain behavior of lymphoid, hematopoietic and related tissue, unspecified: Secondary | ICD-10-CM | POA: Diagnosis not present

## 2018-12-10 DIAGNOSIS — N6321 Unspecified lump in the left breast, upper outer quadrant: Secondary | ICD-10-CM | POA: Diagnosis not present

## 2018-12-10 DIAGNOSIS — N6325 Unspecified lump in the left breast, overlapping quadrants: Secondary | ICD-10-CM | POA: Diagnosis not present

## 2018-12-10 DIAGNOSIS — N6489 Other specified disorders of breast: Secondary | ICD-10-CM | POA: Diagnosis not present

## 2018-12-15 DIAGNOSIS — E782 Mixed hyperlipidemia: Secondary | ICD-10-CM | POA: Diagnosis not present

## 2018-12-15 DIAGNOSIS — J449 Chronic obstructive pulmonary disease, unspecified: Secondary | ICD-10-CM | POA: Diagnosis not present

## 2018-12-15 DIAGNOSIS — R897 Abnormal histological findings in specimens from other organs, systems and tissues: Secondary | ICD-10-CM | POA: Diagnosis not present

## 2018-12-15 DIAGNOSIS — F331 Major depressive disorder, recurrent, moderate: Secondary | ICD-10-CM | POA: Diagnosis not present

## 2018-12-17 DIAGNOSIS — C50311 Malignant neoplasm of lower-inner quadrant of right female breast: Secondary | ICD-10-CM | POA: Diagnosis not present

## 2018-12-17 DIAGNOSIS — M3500 Sicca syndrome, unspecified: Secondary | ICD-10-CM | POA: Insufficient documentation

## 2018-12-17 DIAGNOSIS — R0600 Dyspnea, unspecified: Secondary | ICD-10-CM | POA: Diagnosis not present

## 2018-12-17 DIAGNOSIS — F99 Mental disorder, not otherwise specified: Secondary | ICD-10-CM | POA: Diagnosis not present

## 2018-12-17 DIAGNOSIS — I10 Essential (primary) hypertension: Secondary | ICD-10-CM | POA: Diagnosis not present

## 2018-12-17 DIAGNOSIS — M25551 Pain in right hip: Secondary | ICD-10-CM | POA: Diagnosis not present

## 2018-12-17 DIAGNOSIS — L821 Other seborrheic keratosis: Secondary | ICD-10-CM | POA: Diagnosis not present

## 2018-12-17 DIAGNOSIS — F329 Major depressive disorder, single episode, unspecified: Secondary | ICD-10-CM | POA: Diagnosis not present

## 2018-12-17 DIAGNOSIS — Z0000 Encounter for general adult medical examination without abnormal findings: Secondary | ICD-10-CM | POA: Diagnosis not present

## 2018-12-17 DIAGNOSIS — D649 Anemia, unspecified: Secondary | ICD-10-CM | POA: Diagnosis not present

## 2018-12-17 DIAGNOSIS — E7800 Pure hypercholesterolemia, unspecified: Secondary | ICD-10-CM | POA: Diagnosis not present

## 2018-12-17 DIAGNOSIS — F419 Anxiety disorder, unspecified: Secondary | ICD-10-CM | POA: Diagnosis not present

## 2018-12-17 DIAGNOSIS — I200 Unstable angina: Secondary | ICD-10-CM | POA: Diagnosis not present

## 2018-12-17 DIAGNOSIS — K642 Third degree hemorrhoids: Secondary | ICD-10-CM | POA: Diagnosis not present

## 2018-12-17 DIAGNOSIS — Z85820 Personal history of malignant melanoma of skin: Secondary | ICD-10-CM | POA: Diagnosis not present

## 2018-12-17 DIAGNOSIS — E119 Type 2 diabetes mellitus without complications: Secondary | ICD-10-CM | POA: Diagnosis not present

## 2018-12-17 DIAGNOSIS — R972 Elevated prostate specific antigen [PSA]: Secondary | ICD-10-CM | POA: Diagnosis not present

## 2018-12-17 DIAGNOSIS — D485 Neoplasm of uncertain behavior of skin: Secondary | ICD-10-CM | POA: Diagnosis not present

## 2018-12-17 DIAGNOSIS — K219 Gastro-esophageal reflux disease without esophagitis: Secondary | ICD-10-CM | POA: Diagnosis not present

## 2018-12-17 DIAGNOSIS — C8590 Non-Hodgkin lymphoma, unspecified, unspecified site: Secondary | ICD-10-CM | POA: Insufficient documentation

## 2018-12-17 DIAGNOSIS — K5909 Other constipation: Secondary | ICD-10-CM | POA: Diagnosis not present

## 2018-12-17 DIAGNOSIS — D225 Melanocytic nevi of trunk: Secondary | ICD-10-CM | POA: Diagnosis not present

## 2018-12-17 DIAGNOSIS — Z20828 Contact with and (suspected) exposure to other viral communicable diseases: Secondary | ICD-10-CM | POA: Diagnosis not present

## 2018-12-17 DIAGNOSIS — Z794 Long term (current) use of insulin: Secondary | ICD-10-CM | POA: Diagnosis not present

## 2018-12-17 DIAGNOSIS — N1831 Chronic kidney disease, stage 3a: Secondary | ICD-10-CM | POA: Diagnosis not present

## 2018-12-17 DIAGNOSIS — F5105 Insomnia due to other mental disorder: Secondary | ICD-10-CM | POA: Diagnosis not present

## 2018-12-17 DIAGNOSIS — Z13220 Encounter for screening for lipoid disorders: Secondary | ICD-10-CM | POA: Diagnosis not present

## 2018-12-17 DIAGNOSIS — I509 Heart failure, unspecified: Secondary | ICD-10-CM | POA: Diagnosis not present

## 2018-12-17 DIAGNOSIS — M25552 Pain in left hip: Secondary | ICD-10-CM | POA: Diagnosis not present

## 2018-12-17 DIAGNOSIS — L570 Actinic keratosis: Secondary | ICD-10-CM | POA: Diagnosis not present

## 2018-12-17 DIAGNOSIS — E7849 Other hyperlipidemia: Secondary | ICD-10-CM | POA: Diagnosis not present

## 2018-12-17 DIAGNOSIS — D2239 Melanocytic nevi of other parts of face: Secondary | ICD-10-CM | POA: Diagnosis not present

## 2018-12-17 DIAGNOSIS — R140 Abdominal distension (gaseous): Secondary | ICD-10-CM | POA: Diagnosis not present

## 2018-12-17 DIAGNOSIS — Z23 Encounter for immunization: Secondary | ICD-10-CM | POA: Diagnosis not present

## 2018-12-17 DIAGNOSIS — F411 Generalized anxiety disorder: Secondary | ICD-10-CM | POA: Diagnosis not present

## 2018-12-17 DIAGNOSIS — D1801 Hemangioma of skin and subcutaneous tissue: Secondary | ICD-10-CM | POA: Diagnosis not present

## 2018-12-17 DIAGNOSIS — I25110 Atherosclerotic heart disease of native coronary artery with unstable angina pectoris: Secondary | ICD-10-CM | POA: Diagnosis not present

## 2018-12-17 DIAGNOSIS — I4819 Other persistent atrial fibrillation: Secondary | ICD-10-CM | POA: Diagnosis not present

## 2018-12-17 DIAGNOSIS — S42202D Unspecified fracture of upper end of left humerus, subsequent encounter for fracture with routine healing: Secondary | ICD-10-CM | POA: Diagnosis not present

## 2018-12-17 DIAGNOSIS — L03115 Cellulitis of right lower limb: Secondary | ICD-10-CM | POA: Diagnosis not present

## 2018-12-17 DIAGNOSIS — Z125 Encounter for screening for malignant neoplasm of prostate: Secondary | ICD-10-CM | POA: Diagnosis not present

## 2018-12-17 DIAGNOSIS — N401 Enlarged prostate with lower urinary tract symptoms: Secondary | ICD-10-CM | POA: Diagnosis not present

## 2018-12-17 DIAGNOSIS — C4442 Squamous cell carcinoma of skin of scalp and neck: Secondary | ICD-10-CM | POA: Diagnosis not present

## 2018-12-17 DIAGNOSIS — R635 Abnormal weight gain: Secondary | ICD-10-CM | POA: Diagnosis not present

## 2018-12-17 DIAGNOSIS — Z1211 Encounter for screening for malignant neoplasm of colon: Secondary | ICD-10-CM | POA: Diagnosis not present

## 2018-12-17 DIAGNOSIS — R7303 Prediabetes: Secondary | ICD-10-CM | POA: Diagnosis not present

## 2018-12-18 ENCOUNTER — Other Ambulatory Visit (HOSPITAL_COMMUNITY): Payer: Self-pay | Admitting: Oncology

## 2018-12-18 ENCOUNTER — Other Ambulatory Visit: Payer: Self-pay | Admitting: Oncology

## 2018-12-18 DIAGNOSIS — C8590 Non-Hodgkin lymphoma, unspecified, unspecified site: Secondary | ICD-10-CM

## 2018-12-18 DIAGNOSIS — C50311 Malignant neoplasm of lower-inner quadrant of right female breast: Secondary | ICD-10-CM

## 2018-12-24 ENCOUNTER — Other Ambulatory Visit (HOSPITAL_COMMUNITY): Admission: RE | Admit: 2018-12-24 | Payer: PPO | Source: Ambulatory Visit

## 2018-12-24 DIAGNOSIS — R635 Abnormal weight gain: Secondary | ICD-10-CM | POA: Diagnosis not present

## 2018-12-24 DIAGNOSIS — N6321 Unspecified lump in the left breast, upper outer quadrant: Secondary | ICD-10-CM | POA: Diagnosis not present

## 2018-12-24 DIAGNOSIS — I428 Other cardiomyopathies: Secondary | ICD-10-CM | POA: Diagnosis not present

## 2018-12-24 DIAGNOSIS — I10 Essential (primary) hypertension: Secondary | ICD-10-CM | POA: Diagnosis not present

## 2018-12-24 DIAGNOSIS — R202 Paresthesia of skin: Secondary | ICD-10-CM | POA: Diagnosis not present

## 2018-12-24 DIAGNOSIS — C50311 Malignant neoplasm of lower-inner quadrant of right female breast: Secondary | ICD-10-CM | POA: Diagnosis not present

## 2018-12-24 DIAGNOSIS — E782 Mixed hyperlipidemia: Secondary | ICD-10-CM | POA: Diagnosis not present

## 2018-12-24 DIAGNOSIS — C8590 Non-Hodgkin lymphoma, unspecified, unspecified site: Secondary | ICD-10-CM | POA: Diagnosis not present

## 2018-12-24 DIAGNOSIS — K429 Umbilical hernia without obstruction or gangrene: Secondary | ICD-10-CM | POA: Diagnosis not present

## 2018-12-24 DIAGNOSIS — I679 Cerebrovascular disease, unspecified: Secondary | ICD-10-CM | POA: Diagnosis not present

## 2018-12-24 DIAGNOSIS — R600 Localized edema: Secondary | ICD-10-CM | POA: Diagnosis not present

## 2018-12-24 DIAGNOSIS — D649 Anemia, unspecified: Secondary | ICD-10-CM | POA: Diagnosis not present

## 2018-12-24 DIAGNOSIS — N6320 Unspecified lump in the left breast, unspecified quadrant: Secondary | ICD-10-CM | POA: Diagnosis not present

## 2018-12-24 DIAGNOSIS — N6489 Other specified disorders of breast: Secondary | ICD-10-CM | POA: Diagnosis not present

## 2018-12-31 DIAGNOSIS — D509 Iron deficiency anemia, unspecified: Secondary | ICD-10-CM | POA: Diagnosis not present

## 2018-12-31 DIAGNOSIS — J449 Chronic obstructive pulmonary disease, unspecified: Secondary | ICD-10-CM | POA: Diagnosis not present

## 2018-12-31 DIAGNOSIS — E119 Type 2 diabetes mellitus without complications: Secondary | ICD-10-CM | POA: Diagnosis not present

## 2018-12-31 DIAGNOSIS — G510 Bell's palsy: Secondary | ICD-10-CM | POA: Diagnosis not present

## 2018-12-31 DIAGNOSIS — I509 Heart failure, unspecified: Secondary | ICD-10-CM | POA: Diagnosis not present

## 2019-01-01 ENCOUNTER — Encounter (HOSPITAL_COMMUNITY): Payer: Self-pay

## 2019-01-01 ENCOUNTER — Encounter (HOSPITAL_COMMUNITY): Payer: PPO | Attending: Oncology

## 2019-01-02 ENCOUNTER — Other Ambulatory Visit: Payer: Self-pay | Admitting: Cardiology

## 2019-01-05 DIAGNOSIS — J441 Chronic obstructive pulmonary disease with (acute) exacerbation: Secondary | ICD-10-CM | POA: Diagnosis not present

## 2019-01-06 ENCOUNTER — Ambulatory Visit (HOSPITAL_COMMUNITY)
Admission: RE | Admit: 2019-01-06 | Discharge: 2019-01-06 | Disposition: A | Discharge status: Home / Self Care | Payer: PPO | Source: Ambulatory Visit | Attending: Oncology | Admitting: Oncology

## 2019-01-06 ENCOUNTER — Other Ambulatory Visit: Payer: Self-pay

## 2019-01-06 DIAGNOSIS — C50311 Malignant neoplasm of lower-inner quadrant of right female breast: Secondary | ICD-10-CM | POA: Diagnosis not present

## 2019-01-06 DIAGNOSIS — C8590 Non-Hodgkin lymphoma, unspecified, unspecified site: Secondary | ICD-10-CM | POA: Diagnosis not present

## 2019-01-06 DIAGNOSIS — C8599 Non-Hodgkin lymphoma, unspecified, extranodal and solid organ sites: Secondary | ICD-10-CM | POA: Diagnosis not present

## 2019-01-06 LAB — GLUCOSE, CAPILLARY: Glucose-Capillary: 81 mg/dL (ref 70–99)

## 2019-01-06 MED ORDER — FLUDEOXYGLUCOSE F - 18 (FDG) INJECTION
8.9100 | Freq: Once | INTRAVENOUS | Status: AC
  Administered 2019-01-06: 12:00:00 8.91 via INTRAVENOUS

## 2019-01-07 DIAGNOSIS — F429 Obsessive-compulsive disorder, unspecified: Secondary | ICD-10-CM | POA: Diagnosis not present

## 2019-01-07 DIAGNOSIS — Z0000 Encounter for general adult medical examination without abnormal findings: Secondary | ICD-10-CM | POA: Diagnosis not present

## 2019-01-07 DIAGNOSIS — J449 Chronic obstructive pulmonary disease, unspecified: Secondary | ICD-10-CM | POA: Diagnosis not present

## 2019-01-07 DIAGNOSIS — F4312 Post-traumatic stress disorder, chronic: Secondary | ICD-10-CM | POA: Diagnosis not present

## 2019-01-07 DIAGNOSIS — F3341 Major depressive disorder, recurrent, in partial remission: Secondary | ICD-10-CM | POA: Diagnosis not present

## 2019-01-07 DIAGNOSIS — E119 Type 2 diabetes mellitus without complications: Secondary | ICD-10-CM | POA: Diagnosis not present

## 2019-01-07 DIAGNOSIS — M3501 Sicca syndrome with keratoconjunctivitis: Secondary | ICD-10-CM | POA: Diagnosis not present

## 2019-01-08 DIAGNOSIS — D649 Anemia, unspecified: Secondary | ICD-10-CM | POA: Diagnosis not present

## 2019-01-08 DIAGNOSIS — C8590 Non-Hodgkin lymphoma, unspecified, unspecified site: Secondary | ICD-10-CM | POA: Diagnosis not present

## 2019-01-08 DIAGNOSIS — M3500 Sicca syndrome, unspecified: Secondary | ICD-10-CM | POA: Diagnosis not present

## 2019-01-13 DIAGNOSIS — C8599 Non-Hodgkin lymphoma, unspecified, extranodal and solid organ sites: Secondary | ICD-10-CM | POA: Diagnosis not present

## 2019-01-13 DIAGNOSIS — M3500 Sicca syndrome, unspecified: Secondary | ICD-10-CM | POA: Diagnosis not present

## 2019-01-13 DIAGNOSIS — C8590 Non-Hodgkin lymphoma, unspecified, unspecified site: Secondary | ICD-10-CM | POA: Diagnosis not present

## 2019-01-21 ENCOUNTER — Telehealth: Payer: Self-pay | Admitting: Cardiology

## 2019-01-21 NOTE — Telephone Encounter (Signed)
Changing to new pharmacy [facility] [phone]

## 2019-01-21 NOTE — Telephone Encounter (Signed)
Noted and changed.  

## 2019-01-26 DIAGNOSIS — Z209 Contact with and (suspected) exposure to unspecified communicable disease: Secondary | ICD-10-CM | POA: Diagnosis not present

## 2019-01-28 DIAGNOSIS — C8519 Unspecified B-cell lymphoma, extranodal and solid organ sites: Secondary | ICD-10-CM | POA: Diagnosis not present

## 2019-01-28 DIAGNOSIS — C8590 Non-Hodgkin lymphoma, unspecified, unspecified site: Secondary | ICD-10-CM | POA: Diagnosis not present

## 2019-01-28 DIAGNOSIS — Z510 Encounter for antineoplastic radiation therapy: Secondary | ICD-10-CM | POA: Diagnosis not present

## 2019-02-03 DIAGNOSIS — C8519 Unspecified B-cell lymphoma, extranodal and solid organ sites: Secondary | ICD-10-CM | POA: Diagnosis not present

## 2019-02-05 DIAGNOSIS — C8519 Unspecified B-cell lymphoma, extranodal and solid organ sites: Secondary | ICD-10-CM | POA: Diagnosis not present

## 2019-02-06 DIAGNOSIS — D649 Anemia, unspecified: Secondary | ICD-10-CM | POA: Diagnosis not present

## 2019-02-06 DIAGNOSIS — G893 Neoplasm related pain (acute) (chronic): Secondary | ICD-10-CM | POA: Diagnosis not present

## 2019-02-06 DIAGNOSIS — M3500 Sicca syndrome, unspecified: Secondary | ICD-10-CM | POA: Diagnosis not present

## 2019-02-06 DIAGNOSIS — C8590 Non-Hodgkin lymphoma, unspecified, unspecified site: Secondary | ICD-10-CM | POA: Diagnosis not present

## 2019-02-12 DIAGNOSIS — C8590 Non-Hodgkin lymphoma, unspecified, unspecified site: Secondary | ICD-10-CM | POA: Diagnosis not present

## 2019-02-12 DIAGNOSIS — C8519 Unspecified B-cell lymphoma, extranodal and solid organ sites: Secondary | ICD-10-CM | POA: Diagnosis not present

## 2019-02-12 DIAGNOSIS — Z510 Encounter for antineoplastic radiation therapy: Secondary | ICD-10-CM | POA: Diagnosis not present

## 2019-02-14 DIAGNOSIS — E782 Mixed hyperlipidemia: Secondary | ICD-10-CM | POA: Diagnosis not present

## 2019-02-14 DIAGNOSIS — D509 Iron deficiency anemia, unspecified: Secondary | ICD-10-CM | POA: Diagnosis not present

## 2019-02-14 DIAGNOSIS — E1165 Type 2 diabetes mellitus with hyperglycemia: Secondary | ICD-10-CM | POA: Diagnosis not present

## 2019-02-16 DIAGNOSIS — C8590 Non-Hodgkin lymphoma, unspecified, unspecified site: Secondary | ICD-10-CM | POA: Diagnosis not present

## 2019-02-19 DIAGNOSIS — C8519 Unspecified B-cell lymphoma, extranodal and solid organ sites: Secondary | ICD-10-CM | POA: Diagnosis not present

## 2019-03-04 DIAGNOSIS — C8590 Non-Hodgkin lymphoma, unspecified, unspecified site: Secondary | ICD-10-CM | POA: Diagnosis not present

## 2019-03-11 DIAGNOSIS — G893 Neoplasm related pain (acute) (chronic): Secondary | ICD-10-CM | POA: Diagnosis not present

## 2019-03-11 DIAGNOSIS — D649 Anemia, unspecified: Secondary | ICD-10-CM | POA: Diagnosis not present

## 2019-03-11 DIAGNOSIS — C8590 Non-Hodgkin lymphoma, unspecified, unspecified site: Secondary | ICD-10-CM | POA: Diagnosis not present

## 2019-03-17 DIAGNOSIS — F3341 Major depressive disorder, recurrent, in partial remission: Secondary | ICD-10-CM | POA: Diagnosis not present

## 2019-03-17 DIAGNOSIS — F4312 Post-traumatic stress disorder, chronic: Secondary | ICD-10-CM | POA: Diagnosis not present

## 2019-03-17 DIAGNOSIS — E1165 Type 2 diabetes mellitus with hyperglycemia: Secondary | ICD-10-CM | POA: Diagnosis not present

## 2019-03-19 DIAGNOSIS — C8590 Non-Hodgkin lymphoma, unspecified, unspecified site: Secondary | ICD-10-CM | POA: Diagnosis not present

## 2019-03-25 DIAGNOSIS — M329 Systemic lupus erythematosus, unspecified: Secondary | ICD-10-CM | POA: Diagnosis not present

## 2019-03-25 DIAGNOSIS — D649 Anemia, unspecified: Secondary | ICD-10-CM | POA: Diagnosis not present

## 2019-03-25 DIAGNOSIS — F419 Anxiety disorder, unspecified: Secondary | ICD-10-CM | POA: Diagnosis not present

## 2019-03-25 DIAGNOSIS — M3501 Sicca syndrome with keratoconjunctivitis: Secondary | ICD-10-CM | POA: Diagnosis not present

## 2019-03-25 DIAGNOSIS — Z7952 Long term (current) use of systemic steroids: Secondary | ICD-10-CM | POA: Diagnosis not present

## 2019-03-25 DIAGNOSIS — M810 Age-related osteoporosis without current pathological fracture: Secondary | ICD-10-CM | POA: Diagnosis not present

## 2019-04-01 DIAGNOSIS — C8590 Non-Hodgkin lymphoma, unspecified, unspecified site: Secondary | ICD-10-CM | POA: Diagnosis not present

## 2019-04-03 ENCOUNTER — Encounter: Payer: Self-pay | Admitting: Cardiology

## 2019-04-03 DIAGNOSIS — K219 Gastro-esophageal reflux disease without esophagitis: Secondary | ICD-10-CM | POA: Diagnosis not present

## 2019-04-03 DIAGNOSIS — I10 Essential (primary) hypertension: Secondary | ICD-10-CM | POA: Diagnosis not present

## 2019-04-03 DIAGNOSIS — D509 Iron deficiency anemia, unspecified: Secondary | ICD-10-CM | POA: Diagnosis not present

## 2019-04-03 DIAGNOSIS — E782 Mixed hyperlipidemia: Secondary | ICD-10-CM | POA: Diagnosis not present

## 2019-04-03 DIAGNOSIS — I509 Heart failure, unspecified: Secondary | ICD-10-CM | POA: Diagnosis not present

## 2019-04-03 DIAGNOSIS — J441 Chronic obstructive pulmonary disease with (acute) exacerbation: Secondary | ICD-10-CM | POA: Diagnosis not present

## 2019-04-03 DIAGNOSIS — G510 Bell's palsy: Secondary | ICD-10-CM | POA: Diagnosis not present

## 2019-04-03 DIAGNOSIS — E1165 Type 2 diabetes mellitus with hyperglycemia: Secondary | ICD-10-CM | POA: Diagnosis not present

## 2019-04-08 DIAGNOSIS — R413 Other amnesia: Secondary | ICD-10-CM | POA: Diagnosis not present

## 2019-04-08 DIAGNOSIS — F5104 Psychophysiologic insomnia: Secondary | ICD-10-CM | POA: Diagnosis not present

## 2019-04-08 DIAGNOSIS — F4312 Post-traumatic stress disorder, chronic: Secondary | ICD-10-CM | POA: Diagnosis not present

## 2019-04-08 DIAGNOSIS — R42 Dizziness and giddiness: Secondary | ICD-10-CM | POA: Diagnosis not present

## 2019-04-08 DIAGNOSIS — Z1331 Encounter for screening for depression: Secondary | ICD-10-CM | POA: Diagnosis not present

## 2019-04-08 DIAGNOSIS — E119 Type 2 diabetes mellitus without complications: Secondary | ICD-10-CM | POA: Diagnosis not present

## 2019-04-08 DIAGNOSIS — J449 Chronic obstructive pulmonary disease, unspecified: Secondary | ICD-10-CM | POA: Diagnosis not present

## 2019-04-08 DIAGNOSIS — M3501 Sicca syndrome with keratoconjunctivitis: Secondary | ICD-10-CM | POA: Diagnosis not present

## 2019-04-17 DIAGNOSIS — E782 Mixed hyperlipidemia: Secondary | ICD-10-CM | POA: Diagnosis not present

## 2019-04-17 DIAGNOSIS — F3341 Major depressive disorder, recurrent, in partial remission: Secondary | ICD-10-CM | POA: Diagnosis not present

## 2019-04-17 DIAGNOSIS — I10 Essential (primary) hypertension: Secondary | ICD-10-CM | POA: Diagnosis not present

## 2019-05-18 ENCOUNTER — Other Ambulatory Visit: Payer: Self-pay

## 2019-05-18 ENCOUNTER — Ambulatory Visit (INDEPENDENT_AMBULATORY_CARE_PROVIDER_SITE_OTHER): Payer: PPO | Admitting: Cardiology

## 2019-05-18 ENCOUNTER — Encounter: Payer: Self-pay | Admitting: Cardiology

## 2019-05-18 VITALS — BP 101/55 | HR 86 | Temp 98.2°F | Ht 64.5 in | Wt 163.2 lb

## 2019-05-18 DIAGNOSIS — I5022 Chronic systolic (congestive) heart failure: Secondary | ICD-10-CM

## 2019-05-18 DIAGNOSIS — I10 Essential (primary) hypertension: Secondary | ICD-10-CM | POA: Diagnosis not present

## 2019-05-18 DIAGNOSIS — I509 Heart failure, unspecified: Secondary | ICD-10-CM | POA: Diagnosis not present

## 2019-05-18 DIAGNOSIS — E1165 Type 2 diabetes mellitus with hyperglycemia: Secondary | ICD-10-CM | POA: Diagnosis not present
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
## 2019-05-18 NOTE — Patient Instructions (Signed)

## 2019-05-19 ENCOUNTER — Encounter: Payer: Self-pay | Admitting: *Deleted

## 2019-06-17 DIAGNOSIS — E1165 Type 2 diabetes mellitus with hyperglycemia: Secondary | ICD-10-CM | POA: Diagnosis not present

## 2019-06-17 DIAGNOSIS — I10 Essential (primary) hypertension: Secondary | ICD-10-CM | POA: Diagnosis not present

## 2019-06-24 DIAGNOSIS — Z23 Encounter for immunization: Secondary | ICD-10-CM | POA: Diagnosis not present

## 2019-07-01 DIAGNOSIS — C8590 Non-Hodgkin lymphoma, unspecified, unspecified site: Secondary | ICD-10-CM | POA: Diagnosis not present

## 2019-07-06 DIAGNOSIS — C8590 Non-Hodgkin lymphoma, unspecified, unspecified site: Secondary | ICD-10-CM | POA: Diagnosis not present

## 2019-07-06 DIAGNOSIS — G893 Neoplasm related pain (acute) (chronic): Secondary | ICD-10-CM | POA: Diagnosis not present

## 2019-07-06 DIAGNOSIS — D649 Anemia, unspecified: Secondary | ICD-10-CM | POA: Diagnosis not present

## 2019-07-13 DIAGNOSIS — C8590 Non-Hodgkin lymphoma, unspecified, unspecified site: Secondary | ICD-10-CM | POA: Diagnosis not present

## 2019-07-17 DIAGNOSIS — E1165 Type 2 diabetes mellitus with hyperglycemia: Secondary | ICD-10-CM | POA: Diagnosis not present

## 2019-07-17 DIAGNOSIS — J441 Chronic obstructive pulmonary disease with (acute) exacerbation: Secondary | ICD-10-CM | POA: Diagnosis not present

## 2019-08-17 DIAGNOSIS — J441 Chronic obstructive pulmonary disease with (acute) exacerbation: Secondary | ICD-10-CM | POA: Diagnosis not present

## 2019-08-17 DIAGNOSIS — E1165 Type 2 diabetes mellitus with hyperglycemia: Secondary | ICD-10-CM | POA: Diagnosis not present

## 2019-09-15 DIAGNOSIS — R0781 Pleurodynia: Secondary | ICD-10-CM | POA: Diagnosis not present

## 2019-09-15 DIAGNOSIS — S299XXA Unspecified injury of thorax, initial encounter: Secondary | ICD-10-CM | POA: Diagnosis not present

## 2019-09-15 DIAGNOSIS — Z6828 Body mass index (BMI) 28.0-28.9, adult: Secondary | ICD-10-CM | POA: Diagnosis not present

## 2019-09-17 DIAGNOSIS — J449 Chronic obstructive pulmonary disease, unspecified: Secondary | ICD-10-CM | POA: Diagnosis not present

## 2019-09-17 DIAGNOSIS — E1165 Type 2 diabetes mellitus with hyperglycemia: Secondary | ICD-10-CM | POA: Diagnosis not present

## 2019-10-16 DIAGNOSIS — E7849 Other hyperlipidemia: Secondary | ICD-10-CM | POA: Diagnosis not present

## 2019-10-16 DIAGNOSIS — E1165 Type 2 diabetes mellitus with hyperglycemia: Secondary | ICD-10-CM | POA: Diagnosis not present

## 2019-11-13 DIAGNOSIS — E1165 Type 2 diabetes mellitus with hyperglycemia: Secondary | ICD-10-CM | POA: Diagnosis not present

## 2019-11-13 DIAGNOSIS — E7849 Other hyperlipidemia: Secondary | ICD-10-CM | POA: Diagnosis not present

## 2019-12-04 DIAGNOSIS — C8590 Non-Hodgkin lymphoma, unspecified, unspecified site: Secondary | ICD-10-CM | POA: Diagnosis not present

## 2019-12-08 DIAGNOSIS — C8590 Non-Hodgkin lymphoma, unspecified, unspecified site: Secondary | ICD-10-CM | POA: Diagnosis not present

## 2019-12-08 DIAGNOSIS — R928 Other abnormal and inconclusive findings on diagnostic imaging of breast: Secondary | ICD-10-CM | POA: Diagnosis not present
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
## 2019-12-16 ENCOUNTER — Other Ambulatory Visit: Payer: Self-pay

## 2019-12-16 ENCOUNTER — Ambulatory Visit (INDEPENDENT_AMBULATORY_CARE_PROVIDER_SITE_OTHER): Payer: PPO | Admitting: Family Medicine

## 2019-12-16 ENCOUNTER — Encounter: Payer: Self-pay | Admitting: Family Medicine

## 2019-12-16 VITALS — BP 110/60 | HR 80 | Ht 64.5 in | Wt 164.8 lb

## 2019-12-16 DIAGNOSIS — I10 Essential (primary) hypertension: Secondary | ICD-10-CM | POA: Diagnosis not present

## 2019-12-16 DIAGNOSIS — E1165 Type 2 diabetes mellitus with hyperglycemia: Secondary | ICD-10-CM | POA: Diagnosis not present

## 2019-12-16 DIAGNOSIS — I5042 Chronic combined systolic (congestive) and diastolic (congestive) heart failure: Secondary | ICD-10-CM

## 2019-12-16 MED ORDER — NITROGLYCERIN 0.4 MG SL SUBL: 0.4000 mg | SUBLINGUAL_TABLET | SUBLINGUAL | 3 refills | Status: AC | PRN

## 2019-12-16 MED ORDER — SIMVASTATIN 20 MG PO TABS: ORAL_TABLET | ORAL | 3 refills | Status: DC

## 2019-12-16 NOTE — Patient Instructions (Addendum)
Medication Instructions:   Your physician recommends that you continue on your current medications as directed. Please refer to the Current Medication list given to you today.  Labwork:  NONE  Testing/Procedures:  NONE  Follow-Up:  Your physician recommends that you schedule a follow-up appointment in: 6 months (office). You will receive a reminder letter in the mail in about 4 months reminding you to call and schedule your appointment. If you don't receive this letter, please contact our office.  Any Other Special Instructions Will Be Listed Below (If Applicable). Your physician recommends that you weigh, daily, at the same time every day, and in the same amount of clothing. Please record your daily weights.  Contact our office for weight gain of 3 lbs in a day or 5 lbs in a week    If you need a refill on your cardiac medications before your next appointment, please call your pharmacy.

## 2019-12-30 DIAGNOSIS — R928 Other abnormal and inconclusive findings on diagnostic imaging of breast: Secondary | ICD-10-CM | POA: Diagnosis not present

## 2019-12-30 DIAGNOSIS — C8590 Non-Hodgkin lymphoma, unspecified, unspecified site: Secondary | ICD-10-CM | POA: Diagnosis not present

## 2020-01-13 DIAGNOSIS — C8590 Non-Hodgkin lymphoma, unspecified, unspecified site: Secondary | ICD-10-CM | POA: Diagnosis not present

## 2020-01-15 DIAGNOSIS — M069 Rheumatoid arthritis, unspecified: Secondary | ICD-10-CM | POA: Diagnosis not present

## 2020-01-15 DIAGNOSIS — R0781 Pleurodynia: Secondary | ICD-10-CM | POA: Diagnosis not present

## 2020-02-15 DIAGNOSIS — Z87891 Personal history of nicotine dependence: Secondary | ICD-10-CM | POA: Diagnosis not present

## 2020-02-15 DIAGNOSIS — I5022 Chronic systolic (congestive) heart failure: Secondary | ICD-10-CM | POA: Diagnosis not present

## 2020-02-15 DIAGNOSIS — E1165 Type 2 diabetes mellitus with hyperglycemia: Secondary | ICD-10-CM | POA: Diagnosis not present

## 2020-02-15 DIAGNOSIS — I110 Hypertensive heart disease with heart failure: Secondary | ICD-10-CM | POA: Diagnosis not present

## 2020-02-15 DIAGNOSIS — J441 Chronic obstructive pulmonary disease with (acute) exacerbation: Secondary | ICD-10-CM | POA: Diagnosis not present

## 2020-03-08 DIAGNOSIS — Z634 Disappearance and death of family member: Secondary | ICD-10-CM | POA: Diagnosis not present

## 2020-03-08 DIAGNOSIS — M3501 Sicca syndrome with keratoconjunctivitis: Secondary | ICD-10-CM | POA: Diagnosis not present

## 2020-03-08 DIAGNOSIS — R413 Other amnesia: Secondary | ICD-10-CM | POA: Diagnosis not present

## 2020-03-08 DIAGNOSIS — F4312 Post-traumatic stress disorder, chronic: Secondary | ICD-10-CM | POA: Diagnosis not present

## 2020-03-08 DIAGNOSIS — F429 Obsessive-compulsive disorder, unspecified: Secondary | ICD-10-CM | POA: Diagnosis not present

## 2020-03-08 DIAGNOSIS — F4321 Adjustment disorder with depressed mood: Secondary | ICD-10-CM | POA: Diagnosis not present

## 2020-03-08 DIAGNOSIS — E119 Type 2 diabetes mellitus without complications: Secondary | ICD-10-CM | POA: Diagnosis not present

## 2020-03-08 DIAGNOSIS — F5104 Psychophysiologic insomnia: Secondary | ICD-10-CM | POA: Diagnosis not present

## 2020-03-08 DIAGNOSIS — Z6826 Body mass index (BMI) 26.0-26.9, adult: Secondary | ICD-10-CM | POA: Diagnosis not present

## 2020-03-08 DIAGNOSIS — J449 Chronic obstructive pulmonary disease, unspecified: Secondary | ICD-10-CM | POA: Diagnosis not present

## 2020-03-08 DIAGNOSIS — F3341 Major depressive disorder, recurrent, in partial remission: Secondary | ICD-10-CM | POA: Diagnosis not present

## 2020-03-08 DIAGNOSIS — R42 Dizziness and giddiness: Secondary | ICD-10-CM | POA: Diagnosis not present

## 2020-03-16 DIAGNOSIS — I5022 Chronic systolic (congestive) heart failure: Secondary | ICD-10-CM | POA: Diagnosis not present

## 2020-03-16 DIAGNOSIS — I110 Hypertensive heart disease with heart failure: Secondary | ICD-10-CM | POA: Diagnosis not present

## 2020-03-16 DIAGNOSIS — Z87891 Personal history of nicotine dependence: Secondary | ICD-10-CM | POA: Diagnosis not present

## 2020-03-16 DIAGNOSIS — J441 Chronic obstructive pulmonary disease with (acute) exacerbation: Secondary | ICD-10-CM | POA: Diagnosis not present

## 2020-03-16 DIAGNOSIS — E1165 Type 2 diabetes mellitus with hyperglycemia: Secondary | ICD-10-CM | POA: Diagnosis not present

## 2020-03-28 DIAGNOSIS — R413 Other amnesia: Secondary | ICD-10-CM | POA: Diagnosis not present

## 2020-04-15 DIAGNOSIS — I110 Hypertensive heart disease with heart failure: Secondary | ICD-10-CM | POA: Diagnosis not present

## 2020-04-15 DIAGNOSIS — J441 Chronic obstructive pulmonary disease with (acute) exacerbation: Secondary | ICD-10-CM | POA: Diagnosis not present

## 2020-04-15 DIAGNOSIS — E1165 Type 2 diabetes mellitus with hyperglycemia: Secondary | ICD-10-CM | POA: Diagnosis not present

## 2020-04-15 DIAGNOSIS — Z87891 Personal history of nicotine dependence: Secondary | ICD-10-CM | POA: Diagnosis not present

## 2020-04-15 DIAGNOSIS — I5022 Chronic systolic (congestive) heart failure: Secondary | ICD-10-CM | POA: Diagnosis not present

## 2020-05-16 DIAGNOSIS — M3500 Sicca syndrome, unspecified: Secondary | ICD-10-CM | POA: Diagnosis not present

## 2020-05-16 DIAGNOSIS — M8580 Other specified disorders of bone density and structure, unspecified site: Secondary | ICD-10-CM | POA: Diagnosis not present

## 2020-05-16 DIAGNOSIS — Z6827 Body mass index (BMI) 27.0-27.9, adult: Secondary | ICD-10-CM | POA: Diagnosis not present

## 2020-05-16 DIAGNOSIS — E663 Overweight: Secondary | ICD-10-CM | POA: Diagnosis not present

## 2020-05-17 DIAGNOSIS — I110 Hypertensive heart disease with heart failure: Secondary | ICD-10-CM | POA: Diagnosis not present

## 2020-05-17 DIAGNOSIS — Z87891 Personal history of nicotine dependence: Secondary | ICD-10-CM | POA: Diagnosis not present

## 2020-05-17 DIAGNOSIS — E1165 Type 2 diabetes mellitus with hyperglycemia: Secondary | ICD-10-CM | POA: Diagnosis not present

## 2020-05-17 DIAGNOSIS — I5022 Chronic systolic (congestive) heart failure: Secondary | ICD-10-CM | POA: Diagnosis not present

## 2020-05-17 DIAGNOSIS — J441 Chronic obstructive pulmonary disease with (acute) exacerbation: Secondary | ICD-10-CM | POA: Diagnosis not present

## 2020-06-01 DIAGNOSIS — Z8572 Personal history of non-Hodgkin lymphomas: Secondary | ICD-10-CM | POA: Diagnosis not present

## 2020-06-01 DIAGNOSIS — Z08 Encounter for follow-up examination after completed treatment for malignant neoplasm: Secondary | ICD-10-CM | POA: Diagnosis not present

## 2020-06-01 DIAGNOSIS — C8590 Non-Hodgkin lymphoma, unspecified, unspecified site: Secondary | ICD-10-CM | POA: Diagnosis not present

## 2020-06-01 DIAGNOSIS — G893 Neoplasm related pain (acute) (chronic): Secondary | ICD-10-CM | POA: Diagnosis not present

## 2020-06-01 DIAGNOSIS — Z923 Personal history of irradiation: Secondary | ICD-10-CM | POA: Diagnosis not present

## 2020-06-01 DIAGNOSIS — M3500 Sicca syndrome, unspecified: Secondary | ICD-10-CM | POA: Diagnosis not present

## 2020-06-09 DIAGNOSIS — C8590 Non-Hodgkin lymphoma, unspecified, unspecified site: Secondary | ICD-10-CM | POA: Diagnosis not present

## 2020-06-09 DIAGNOSIS — D500 Iron deficiency anemia secondary to blood loss (chronic): Secondary | ICD-10-CM | POA: Diagnosis not present

## 2020-06-09 DIAGNOSIS — M351 Other overlap syndromes: Secondary | ICD-10-CM | POA: Diagnosis not present

## 2020-06-16 DIAGNOSIS — J441 Chronic obstructive pulmonary disease with (acute) exacerbation: Secondary | ICD-10-CM | POA: Diagnosis not present

## 2020-06-16 DIAGNOSIS — Z87891 Personal history of nicotine dependence: Secondary | ICD-10-CM | POA: Diagnosis not present

## 2020-06-16 DIAGNOSIS — I110 Hypertensive heart disease with heart failure: Secondary | ICD-10-CM | POA: Diagnosis not present

## 2020-06-16 DIAGNOSIS — I5022 Chronic systolic (congestive) heart failure: Secondary | ICD-10-CM | POA: Diagnosis not present

## 2020-06-16 DIAGNOSIS — E1165 Type 2 diabetes mellitus with hyperglycemia: Secondary | ICD-10-CM | POA: Diagnosis not present

## 2020-06-20 DIAGNOSIS — M351 Other overlap syndromes: Secondary | ICD-10-CM | POA: Insufficient documentation

## 2020-07-04 DIAGNOSIS — Z23 Encounter for immunization: Secondary | ICD-10-CM | POA: Diagnosis not present

## 2020-07-04 DIAGNOSIS — C8590 Non-Hodgkin lymphoma, unspecified, unspecified site: Secondary | ICD-10-CM | POA: Diagnosis not present

## 2020-07-04 DIAGNOSIS — D500 Iron deficiency anemia secondary to blood loss (chronic): Secondary | ICD-10-CM | POA: Diagnosis not present

## 2020-07-07 DIAGNOSIS — Z23 Encounter for immunization: Secondary | ICD-10-CM | POA: Diagnosis not present

## 2020-07-12 DIAGNOSIS — R1030 Lower abdominal pain, unspecified: Secondary | ICD-10-CM | POA: Diagnosis not present

## 2020-07-12 DIAGNOSIS — R8279 Other abnormal findings on microbiological examination of urine: Secondary | ICD-10-CM | POA: Diagnosis not present

## 2020-07-12 DIAGNOSIS — R350 Frequency of micturition: Secondary | ICD-10-CM | POA: Diagnosis not present

## 2020-07-12 DIAGNOSIS — R8290 Unspecified abnormal findings in urine: Secondary | ICD-10-CM | POA: Diagnosis not present

## 2020-07-16 DIAGNOSIS — I5022 Chronic systolic (congestive) heart failure: Secondary | ICD-10-CM | POA: Diagnosis not present

## 2020-07-16 DIAGNOSIS — I110 Hypertensive heart disease with heart failure: Secondary | ICD-10-CM | POA: Diagnosis not present

## 2020-07-16 DIAGNOSIS — J441 Chronic obstructive pulmonary disease with (acute) exacerbation: Secondary | ICD-10-CM | POA: Diagnosis not present

## 2020-07-16 DIAGNOSIS — E1165 Type 2 diabetes mellitus with hyperglycemia: Secondary | ICD-10-CM | POA: Diagnosis not present

## 2020-07-22 DIAGNOSIS — Z6826 Body mass index (BMI) 26.0-26.9, adult: Secondary | ICD-10-CM | POA: Diagnosis not present

## 2020-07-22 DIAGNOSIS — N390 Urinary tract infection, site not specified: Secondary | ICD-10-CM | POA: Diagnosis not present

## 2020-08-03 DIAGNOSIS — R0600 Dyspnea, unspecified: Secondary | ICD-10-CM | POA: Diagnosis not present

## 2020-08-16 DIAGNOSIS — J441 Chronic obstructive pulmonary disease with (acute) exacerbation: Secondary | ICD-10-CM | POA: Diagnosis not present

## 2020-08-16 DIAGNOSIS — E1165 Type 2 diabetes mellitus with hyperglycemia: Secondary | ICD-10-CM | POA: Diagnosis not present

## 2020-08-16 DIAGNOSIS — I5022 Chronic systolic (congestive) heart failure: Secondary | ICD-10-CM | POA: Diagnosis not present

## 2020-08-16 DIAGNOSIS — I110 Hypertensive heart disease with heart failure: Secondary | ICD-10-CM | POA: Diagnosis not present

## 2020-09-01 DIAGNOSIS — E663 Overweight: Secondary | ICD-10-CM | POA: Diagnosis not present

## 2020-09-01 DIAGNOSIS — Z6826 Body mass index (BMI) 26.0-26.9, adult: Secondary | ICD-10-CM | POA: Diagnosis not present

## 2020-09-01 DIAGNOSIS — M3500 Sicca syndrome, unspecified: Secondary | ICD-10-CM | POA: Diagnosis not present

## 2020-09-01 DIAGNOSIS — M8580 Other specified disorders of bone density and structure, unspecified site: Secondary | ICD-10-CM | POA: Diagnosis not present

## 2020-09-02 DIAGNOSIS — E1165 Type 2 diabetes mellitus with hyperglycemia: Secondary | ICD-10-CM | POA: Diagnosis not present

## 2020-09-02 DIAGNOSIS — E782 Mixed hyperlipidemia: Secondary | ICD-10-CM | POA: Diagnosis not present

## 2020-09-02 DIAGNOSIS — K219 Gastro-esophageal reflux disease without esophagitis: Secondary | ICD-10-CM | POA: Diagnosis not present

## 2020-09-02 DIAGNOSIS — J449 Chronic obstructive pulmonary disease, unspecified: Secondary | ICD-10-CM | POA: Diagnosis not present

## 2020-09-02 DIAGNOSIS — N390 Urinary tract infection, site not specified: Secondary | ICD-10-CM | POA: Diagnosis not present

## 2020-09-02 DIAGNOSIS — I10 Essential (primary) hypertension: Secondary | ICD-10-CM | POA: Diagnosis not present

## 2020-09-06 DIAGNOSIS — F4321 Adjustment disorder with depressed mood: Secondary | ICD-10-CM | POA: Diagnosis not present

## 2020-09-06 DIAGNOSIS — R413 Other amnesia: Secondary | ICD-10-CM | POA: Diagnosis not present

## 2020-09-06 DIAGNOSIS — R42 Dizziness and giddiness: Secondary | ICD-10-CM | POA: Diagnosis not present

## 2020-09-06 DIAGNOSIS — Z0000 Encounter for general adult medical examination without abnormal findings: Secondary | ICD-10-CM | POA: Diagnosis not present

## 2020-09-06 DIAGNOSIS — C8590 Non-Hodgkin lymphoma, unspecified, unspecified site: Secondary | ICD-10-CM | POA: Diagnosis not present

## 2020-09-06 DIAGNOSIS — M3501 Sicca syndrome with keratoconjunctivitis: Secondary | ICD-10-CM | POA: Diagnosis not present

## 2020-09-06 DIAGNOSIS — E119 Type 2 diabetes mellitus without complications: Secondary | ICD-10-CM | POA: Diagnosis not present

## 2020-09-06 DIAGNOSIS — F4312 Post-traumatic stress disorder, chronic: Secondary | ICD-10-CM | POA: Diagnosis not present

## 2020-09-16 DIAGNOSIS — I110 Hypertensive heart disease with heart failure: Secondary | ICD-10-CM | POA: Diagnosis not present

## 2020-09-16 DIAGNOSIS — I5022 Chronic systolic (congestive) heart failure: Secondary | ICD-10-CM | POA: Diagnosis not present

## 2020-09-16 DIAGNOSIS — E1165 Type 2 diabetes mellitus with hyperglycemia: Secondary | ICD-10-CM | POA: Diagnosis not present

## 2020-09-16 DIAGNOSIS — J441 Chronic obstructive pulmonary disease with (acute) exacerbation: Secondary | ICD-10-CM | POA: Diagnosis not present

## 2020-09-17 HISTORY — PX: COLONOSCOPY: SHX174

## 2020-09-29 DIAGNOSIS — Z800 Family history of malignant neoplasm of digestive organs: Secondary | ICD-10-CM | POA: Diagnosis not present

## 2020-09-29 DIAGNOSIS — D120 Benign neoplasm of cecum: Secondary | ICD-10-CM | POA: Diagnosis not present

## 2020-10-14 DIAGNOSIS — Z01818 Encounter for other preprocedural examination: Secondary | ICD-10-CM | POA: Diagnosis not present

## 2020-10-15 DIAGNOSIS — J441 Chronic obstructive pulmonary disease with (acute) exacerbation: Secondary | ICD-10-CM | POA: Diagnosis not present

## 2020-10-15 DIAGNOSIS — E1165 Type 2 diabetes mellitus with hyperglycemia: Secondary | ICD-10-CM | POA: Diagnosis not present

## 2020-10-15 DIAGNOSIS — I5022 Chronic systolic (congestive) heart failure: Secondary | ICD-10-CM | POA: Diagnosis not present

## 2020-10-15 DIAGNOSIS — Z87891 Personal history of nicotine dependence: Secondary | ICD-10-CM | POA: Diagnosis not present

## 2020-10-15 DIAGNOSIS — I110 Hypertensive heart disease with heart failure: Secondary | ICD-10-CM | POA: Diagnosis not present

## 2020-10-17 DIAGNOSIS — E119 Type 2 diabetes mellitus without complications: Secondary | ICD-10-CM | POA: Diagnosis not present

## 2020-10-17 DIAGNOSIS — F411 Generalized anxiety disorder: Secondary | ICD-10-CM | POA: Diagnosis not present

## 2020-10-17 DIAGNOSIS — D126 Benign neoplasm of colon, unspecified: Secondary | ICD-10-CM | POA: Diagnosis not present

## 2020-10-17 DIAGNOSIS — Z7984 Long term (current) use of oral hypoglycemic drugs: Secondary | ICD-10-CM | POA: Diagnosis not present

## 2020-10-17 DIAGNOSIS — Z7982 Long term (current) use of aspirin: Secondary | ICD-10-CM | POA: Diagnosis not present

## 2020-10-17 DIAGNOSIS — Z885 Allergy status to narcotic agent status: Secondary | ICD-10-CM | POA: Diagnosis not present

## 2020-10-17 DIAGNOSIS — Z1211 Encounter for screening for malignant neoplasm of colon: Secondary | ICD-10-CM | POA: Diagnosis not present

## 2020-10-17 DIAGNOSIS — J449 Chronic obstructive pulmonary disease, unspecified: Secondary | ICD-10-CM | POA: Diagnosis not present

## 2020-10-17 DIAGNOSIS — K635 Polyp of colon: Secondary | ICD-10-CM | POA: Diagnosis not present

## 2020-10-17 DIAGNOSIS — D123 Benign neoplasm of transverse colon: Secondary | ICD-10-CM | POA: Diagnosis not present

## 2020-10-17 DIAGNOSIS — Z09 Encounter for follow-up examination after completed treatment for conditions other than malignant neoplasm: Secondary | ICD-10-CM | POA: Diagnosis not present

## 2020-10-17 DIAGNOSIS — Z86010 Personal history of colonic polyps: Secondary | ICD-10-CM | POA: Diagnosis not present

## 2020-10-17 DIAGNOSIS — K5730 Diverticulosis of large intestine without perforation or abscess without bleeding: Secondary | ICD-10-CM | POA: Diagnosis not present

## 2020-10-17 DIAGNOSIS — F4312 Post-traumatic stress disorder, chronic: Secondary | ICD-10-CM | POA: Diagnosis not present

## 2020-10-17 DIAGNOSIS — I509 Heart failure, unspecified: Secondary | ICD-10-CM | POA: Diagnosis not present

## 2020-10-17 DIAGNOSIS — Z79899 Other long term (current) drug therapy: Secondary | ICD-10-CM | POA: Diagnosis not present

## 2020-10-17 DIAGNOSIS — K6289 Other specified diseases of anus and rectum: Secondary | ICD-10-CM | POA: Diagnosis not present

## 2020-10-17 DIAGNOSIS — Z800 Family history of malignant neoplasm of digestive organs: Secondary | ICD-10-CM | POA: Diagnosis not present

## 2020-10-17 DIAGNOSIS — Z87891 Personal history of nicotine dependence: Secondary | ICD-10-CM | POA: Diagnosis not present

## 2020-11-01 DIAGNOSIS — D125 Benign neoplasm of sigmoid colon: Secondary | ICD-10-CM | POA: Diagnosis not present

## 2020-11-01 DIAGNOSIS — D126 Benign neoplasm of colon, unspecified: Secondary | ICD-10-CM | POA: Insufficient documentation

## 2020-11-01 DIAGNOSIS — D122 Benign neoplasm of ascending colon: Secondary | ICD-10-CM | POA: Diagnosis not present

## 2020-11-14 DIAGNOSIS — M069 Rheumatoid arthritis, unspecified: Secondary | ICD-10-CM | POA: Diagnosis not present

## 2020-11-14 DIAGNOSIS — J441 Chronic obstructive pulmonary disease with (acute) exacerbation: Secondary | ICD-10-CM | POA: Diagnosis not present

## 2020-11-14 DIAGNOSIS — B029 Zoster without complications: Secondary | ICD-10-CM | POA: Diagnosis not present

## 2020-11-14 DIAGNOSIS — I5022 Chronic systolic (congestive) heart failure: Secondary | ICD-10-CM | POA: Diagnosis not present

## 2020-11-14 DIAGNOSIS — E1165 Type 2 diabetes mellitus with hyperglycemia: Secondary | ICD-10-CM | POA: Diagnosis not present

## 2020-11-14 DIAGNOSIS — Z87891 Personal history of nicotine dependence: Secondary | ICD-10-CM | POA: Diagnosis not present

## 2020-11-14 DIAGNOSIS — Z6825 Body mass index (BMI) 25.0-25.9, adult: Secondary | ICD-10-CM | POA: Diagnosis not present

## 2020-11-14 DIAGNOSIS — I110 Hypertensive heart disease with heart failure: Secondary | ICD-10-CM | POA: Diagnosis not present

## 2020-11-30 DIAGNOSIS — R079 Chest pain, unspecified: Secondary | ICD-10-CM | POA: Diagnosis not present

## 2020-11-30 DIAGNOSIS — I509 Heart failure, unspecified: Secondary | ICD-10-CM | POA: Diagnosis not present

## 2020-11-30 DIAGNOSIS — N6489 Other specified disorders of breast: Secondary | ICD-10-CM | POA: Diagnosis not present

## 2020-11-30 DIAGNOSIS — Z800 Family history of malignant neoplasm of digestive organs: Secondary | ICD-10-CM | POA: Diagnosis not present

## 2020-11-30 DIAGNOSIS — C8590 Non-Hodgkin lymphoma, unspecified, unspecified site: Secondary | ICD-10-CM | POA: Diagnosis not present

## 2020-11-30 DIAGNOSIS — R9431 Abnormal electrocardiogram [ECG] [EKG]: Secondary | ICD-10-CM | POA: Diagnosis not present

## 2020-11-30 DIAGNOSIS — R195 Other fecal abnormalities: Secondary | ICD-10-CM | POA: Diagnosis not present

## 2020-11-30 DIAGNOSIS — Z87891 Personal history of nicotine dependence: Secondary | ICD-10-CM | POA: Diagnosis not present

## 2020-11-30 DIAGNOSIS — Z8572 Personal history of non-Hodgkin lymphomas: Secondary | ICD-10-CM | POA: Diagnosis not present

## 2020-11-30 DIAGNOSIS — D649 Anemia, unspecified: Secondary | ICD-10-CM | POA: Diagnosis not present

## 2020-11-30 DIAGNOSIS — J449 Chronic obstructive pulmonary disease, unspecified: Secondary | ICD-10-CM | POA: Diagnosis not present

## 2020-11-30 DIAGNOSIS — E119 Type 2 diabetes mellitus without complications: Secondary | ICD-10-CM | POA: Diagnosis not present

## 2020-11-30 DIAGNOSIS — Z801 Family history of malignant neoplasm of trachea, bronchus and lung: Secondary | ICD-10-CM | POA: Diagnosis not present

## 2020-11-30 DIAGNOSIS — Z08 Encounter for follow-up examination after completed treatment for malignant neoplasm: Secondary | ICD-10-CM | POA: Diagnosis not present

## 2020-11-30 DIAGNOSIS — Z7984 Long term (current) use of oral hypoglycemic drugs: Secondary | ICD-10-CM | POA: Diagnosis not present

## 2020-11-30 DIAGNOSIS — Z9049 Acquired absence of other specified parts of digestive tract: Secondary | ICD-10-CM | POA: Diagnosis not present

## 2020-11-30 DIAGNOSIS — M3500 Sicca syndrome, unspecified: Secondary | ICD-10-CM | POA: Diagnosis not present

## 2020-11-30 DIAGNOSIS — Z803 Family history of malignant neoplasm of breast: Secondary | ICD-10-CM | POA: Diagnosis not present

## 2020-11-30 DIAGNOSIS — K921 Melena: Secondary | ICD-10-CM | POA: Diagnosis not present

## 2020-11-30 DIAGNOSIS — Z923 Personal history of irradiation: Secondary | ICD-10-CM | POA: Diagnosis not present

## 2020-12-01 ENCOUNTER — Encounter: Payer: Self-pay | Admitting: Internal Medicine

## 2020-12-05 DIAGNOSIS — Z87891 Personal history of nicotine dependence: Secondary | ICD-10-CM | POA: Diagnosis not present

## 2020-12-07 DIAGNOSIS — Z20828 Contact with and (suspected) exposure to other viral communicable diseases: Secondary | ICD-10-CM | POA: Diagnosis not present

## 2020-12-07 DIAGNOSIS — A084 Viral intestinal infection, unspecified: Secondary | ICD-10-CM | POA: Diagnosis not present

## 2020-12-07 DIAGNOSIS — R110 Nausea: Secondary | ICD-10-CM | POA: Diagnosis not present

## 2020-12-09 DIAGNOSIS — M3500 Sicca syndrome, unspecified: Secondary | ICD-10-CM | POA: Diagnosis not present

## 2020-12-14 DIAGNOSIS — E1165 Type 2 diabetes mellitus with hyperglycemia: Secondary | ICD-10-CM | POA: Diagnosis not present

## 2020-12-14 DIAGNOSIS — J441 Chronic obstructive pulmonary disease with (acute) exacerbation: Secondary | ICD-10-CM | POA: Diagnosis not present

## 2020-12-14 DIAGNOSIS — I5022 Chronic systolic (congestive) heart failure: Secondary | ICD-10-CM | POA: Diagnosis not present

## 2020-12-14 DIAGNOSIS — I110 Hypertensive heart disease with heart failure: Secondary | ICD-10-CM | POA: Diagnosis not present

## 2020-12-14 DIAGNOSIS — Z87891 Personal history of nicotine dependence: Secondary | ICD-10-CM | POA: Diagnosis not present

## 2020-12-15 DIAGNOSIS — R300 Dysuria: Secondary | ICD-10-CM | POA: Diagnosis not present

## 2020-12-15 DIAGNOSIS — Z20828 Contact with and (suspected) exposure to other viral communicable diseases: Secondary | ICD-10-CM | POA: Diagnosis not present

## 2020-12-15 DIAGNOSIS — A084 Viral intestinal infection, unspecified: Secondary | ICD-10-CM | POA: Diagnosis not present

## 2020-12-19 ENCOUNTER — Other Ambulatory Visit: Payer: Self-pay | Admitting: *Deleted

## 2020-12-19 MED ORDER — SIMVASTATIN 20 MG PO TABS: ORAL_TABLET | ORAL | 0 refills | Status: AC

## 2021-01-02 DIAGNOSIS — D500 Iron deficiency anemia secondary to blood loss (chronic): Secondary | ICD-10-CM | POA: Diagnosis not present

## 2021-01-02 DIAGNOSIS — M3500 Sicca syndrome, unspecified: Secondary | ICD-10-CM | POA: Diagnosis not present

## 2021-01-02 DIAGNOSIS — C8590 Non-Hodgkin lymphoma, unspecified, unspecified site: Secondary | ICD-10-CM | POA: Diagnosis not present

## 2021-01-02 DIAGNOSIS — D528 Other folate deficiency anemias: Secondary | ICD-10-CM | POA: Diagnosis not present

## 2021-01-04 DIAGNOSIS — R928 Other abnormal and inconclusive findings on diagnostic imaging of breast: Secondary | ICD-10-CM | POA: Diagnosis not present

## 2021-01-04 DIAGNOSIS — C8590 Non-Hodgkin lymphoma, unspecified, unspecified site: Secondary | ICD-10-CM | POA: Diagnosis not present

## 2021-01-04 DIAGNOSIS — N6489 Other specified disorders of breast: Secondary | ICD-10-CM | POA: Diagnosis not present

## 2021-01-05 DIAGNOSIS — C8590 Non-Hodgkin lymphoma, unspecified, unspecified site: Secondary | ICD-10-CM | POA: Diagnosis not present

## 2021-01-05 DIAGNOSIS — D500 Iron deficiency anemia secondary to blood loss (chronic): Secondary | ICD-10-CM | POA: Diagnosis not present

## 2021-01-11 ENCOUNTER — Ambulatory Visit (INDEPENDENT_AMBULATORY_CARE_PROVIDER_SITE_OTHER): Payer: HMO | Admitting: Gastroenterology

## 2021-01-11 ENCOUNTER — Other Ambulatory Visit: Payer: Self-pay

## 2021-01-11 ENCOUNTER — Encounter: Payer: Self-pay | Admitting: Gastroenterology

## 2021-01-11 DIAGNOSIS — R1013 Epigastric pain: Secondary | ICD-10-CM

## 2021-01-11 DIAGNOSIS — R1310 Dysphagia, unspecified: Secondary | ICD-10-CM | POA: Diagnosis not present

## 2021-01-11 DIAGNOSIS — R1312 Dysphagia, oropharyngeal phase: Secondary | ICD-10-CM | POA: Insufficient documentation

## 2021-01-11 MED ORDER — PANTOPRAZOLE SODIUM 40 MG PO TBEC: 40.0000 mg | DELAYED_RELEASE_TABLET | Freq: Every day | ORAL | 3 refills | Status: DC

## 2021-01-11 NOTE — Patient Instructions (Signed)
Let's stop Pepcid. Instead, I have sent pantoprazole (Protonix) to [place] to take once each morning, 30 minutes before breakfast.   We are arranging an upper endoscopy with dilation in the near future. Please do not take metformin the day of the procedure.   We will see you in follow-up thereafter!  It was a pleasure to see you today. I want to create trusting relationships with patients to provide genuine, compassionate, and quality care. I value your feedback. If you receive a survey regarding your visit,  I greatly appreciate you taking time to fill this out.   Annitta Needs, PhD, ANP-BC [facility]
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
## 2021-01-13 DIAGNOSIS — R109 Unspecified abdominal pain: Secondary | ICD-10-CM | POA: Diagnosis not present

## 2021-01-13 DIAGNOSIS — D500 Iron deficiency anemia secondary to blood loss (chronic): Secondary | ICD-10-CM | POA: Diagnosis not present

## 2021-01-13 DIAGNOSIS — C8590 Non-Hodgkin lymphoma, unspecified, unspecified site: Secondary | ICD-10-CM | POA: Diagnosis not present

## 2021-01-14 DIAGNOSIS — I5022 Chronic systolic (congestive) heart failure: Secondary | ICD-10-CM | POA: Diagnosis not present

## 2021-01-14 DIAGNOSIS — Z87891 Personal history of nicotine dependence: Secondary | ICD-10-CM | POA: Diagnosis not present

## 2021-01-14 DIAGNOSIS — I110 Hypertensive heart disease with heart failure: Secondary | ICD-10-CM | POA: Diagnosis not present

## 2021-01-14 DIAGNOSIS — E1165 Type 2 diabetes mellitus with hyperglycemia: Secondary | ICD-10-CM | POA: Diagnosis not present

## 2021-01-14 DIAGNOSIS — J441 Chronic obstructive pulmonary disease with (acute) exacerbation: Secondary | ICD-10-CM | POA: Diagnosis not present

## 2021-01-16 ENCOUNTER — Encounter: Payer: Self-pay | Admitting: Gastroenterology

## 2021-01-17 ENCOUNTER — Other Ambulatory Visit: Payer: Self-pay | Admitting: Cardiology

## 2021-01-24 DIAGNOSIS — R911 Solitary pulmonary nodule: Secondary | ICD-10-CM | POA: Diagnosis not present

## 2021-01-24 DIAGNOSIS — Z7189 Other specified counseling: Secondary | ICD-10-CM | POA: Diagnosis not present

## 2021-01-24 DIAGNOSIS — Z23 Encounter for immunization: Secondary | ICD-10-CM | POA: Diagnosis not present

## 2021-01-24 DIAGNOSIS — C8590 Non-Hodgkin lymphoma, unspecified, unspecified site: Secondary | ICD-10-CM | POA: Diagnosis not present

## 2021-01-24 DIAGNOSIS — D500 Iron deficiency anemia secondary to blood loss (chronic): Secondary | ICD-10-CM | POA: Diagnosis not present

## 2021-01-24 DIAGNOSIS — Z87891 Personal history of nicotine dependence: Secondary | ICD-10-CM | POA: Diagnosis not present

## 2021-02-13 DIAGNOSIS — E1165 Type 2 diabetes mellitus with hyperglycemia: Secondary | ICD-10-CM | POA: Diagnosis not present

## 2021-02-13 DIAGNOSIS — I110 Hypertensive heart disease with heart failure: Secondary | ICD-10-CM | POA: Diagnosis not present

## 2021-02-13 DIAGNOSIS — J441 Chronic obstructive pulmonary disease with (acute) exacerbation: Secondary | ICD-10-CM | POA: Diagnosis not present

## 2021-02-13 DIAGNOSIS — Z87891 Personal history of nicotine dependence: Secondary | ICD-10-CM | POA: Diagnosis not present

## 2021-02-13 DIAGNOSIS — I5022 Chronic systolic (congestive) heart failure: Secondary | ICD-10-CM | POA: Diagnosis not present

## 2021-02-16 ENCOUNTER — Other Ambulatory Visit (HOSPITAL_COMMUNITY)
Admission: RE | Admit: 2021-02-16 | Discharge: 2021-02-16 | Disposition: A | Discharge status: Home / Self Care | Payer: HMO | Source: Ambulatory Visit | Attending: Internal Medicine | Admitting: Internal Medicine

## 2021-02-16 ENCOUNTER — Other Ambulatory Visit: Payer: Self-pay

## 2021-02-16 DIAGNOSIS — Z79899 Other long term (current) drug therapy: Secondary | ICD-10-CM | POA: Diagnosis not present

## 2021-02-16 DIAGNOSIS — Z8572 Personal history of non-Hodgkin lymphomas: Secondary | ICD-10-CM | POA: Diagnosis not present

## 2021-02-16 DIAGNOSIS — R1310 Dysphagia, unspecified: Secondary | ICD-10-CM | POA: Diagnosis not present

## 2021-02-16 DIAGNOSIS — Z7984 Long term (current) use of oral hypoglycemic drugs: Secondary | ICD-10-CM | POA: Diagnosis not present

## 2021-02-16 DIAGNOSIS — K921 Melena: Secondary | ICD-10-CM | POA: Diagnosis not present

## 2021-02-16 DIAGNOSIS — Z7952 Long term (current) use of systemic steroids: Secondary | ICD-10-CM | POA: Diagnosis not present

## 2021-02-16 DIAGNOSIS — Z800 Family history of malignant neoplasm of digestive organs: Secondary | ICD-10-CM | POA: Diagnosis not present

## 2021-02-16 DIAGNOSIS — K2950 Unspecified chronic gastritis without bleeding: Secondary | ICD-10-CM | POA: Diagnosis not present

## 2021-02-16 DIAGNOSIS — Z87891 Personal history of nicotine dependence: Secondary | ICD-10-CM | POA: Diagnosis not present

## 2021-02-16 DIAGNOSIS — Z7982 Long term (current) use of aspirin: Secondary | ICD-10-CM | POA: Diagnosis not present

## 2021-02-16 DIAGNOSIS — K222 Esophageal obstruction: Secondary | ICD-10-CM | POA: Diagnosis not present

## 2021-02-16 LAB — BASIC METABOLIC PANEL
Anion gap: 8 (ref 5–15)
BUN: 25 mg/dL — ABNORMAL HIGH (ref 8–23)
CO2: 25 mmol/L (ref 22–32)
Calcium: 9.8 mg/dL (ref 8.9–10.3)
Chloride: 107 mmol/L (ref 98–111)
Creatinine, Ser: 1.16 mg/dL — ABNORMAL HIGH (ref 0.44–1.00)
GFR, Estimated: 50 mL/min — ABNORMAL LOW (ref >60)
Glucose, Bld: 150 mg/dL — ABNORMAL HIGH (ref 70–99)
Potassium: 5.2 mmol/L — ABNORMAL HIGH (ref 3.5–5.1)
Sodium: 140 mmol/L (ref 135–145)

## 2021-02-17 ENCOUNTER — Ambulatory Visit (HOSPITAL_COMMUNITY): Payer: HMO | Admitting: Anesthesiology

## 2021-02-17 ENCOUNTER — Other Ambulatory Visit: Payer: Self-pay

## 2021-02-17 ENCOUNTER — Encounter (HOSPITAL_COMMUNITY): Payer: Self-pay

## 2021-02-17 ENCOUNTER — Encounter (HOSPITAL_COMMUNITY)
Admission: RE | Disposition: A | Discharge status: Home / Self Care | Payer: Self-pay | Source: Home / Self Care | Attending: Internal Medicine

## 2021-02-17 ENCOUNTER — Ambulatory Visit (HOSPITAL_COMMUNITY)
Admission: RE | Admit: 2021-02-17 | Discharge: 2021-02-17 | Disposition: A | Discharge status: Home / Self Care | Payer: HMO | Attending: Internal Medicine | Admitting: Internal Medicine

## 2021-02-17 DIAGNOSIS — Z79899 Other long term (current) drug therapy: Secondary | ICD-10-CM | POA: Diagnosis not present

## 2021-02-17 DIAGNOSIS — Z7952 Long term (current) use of systemic steroids: Secondary | ICD-10-CM | POA: Diagnosis not present

## 2021-02-17 DIAGNOSIS — Z87891 Personal history of nicotine dependence: Secondary | ICD-10-CM | POA: Diagnosis not present

## 2021-02-17 DIAGNOSIS — K222 Esophageal obstruction: Secondary | ICD-10-CM

## 2021-02-17 DIAGNOSIS — K2950 Unspecified chronic gastritis without bleeding: Secondary | ICD-10-CM | POA: Diagnosis not present

## 2021-02-17 DIAGNOSIS — K921 Melena: Secondary | ICD-10-CM | POA: Diagnosis not present

## 2021-02-17 DIAGNOSIS — Z7984 Long term (current) use of oral hypoglycemic drugs: Secondary | ICD-10-CM | POA: Insufficient documentation

## 2021-02-17 DIAGNOSIS — Z8572 Personal history of non-Hodgkin lymphomas: Secondary | ICD-10-CM | POA: Diagnosis not present

## 2021-02-17 DIAGNOSIS — Z800 Family history of malignant neoplasm of digestive organs: Secondary | ICD-10-CM | POA: Insufficient documentation

## 2021-02-17 DIAGNOSIS — K2970 Gastritis, unspecified, without bleeding: Secondary | ICD-10-CM

## 2021-02-17 DIAGNOSIS — R1310 Dysphagia, unspecified: Secondary | ICD-10-CM | POA: Diagnosis not present

## 2021-02-17 DIAGNOSIS — Z7982 Long term (current) use of aspirin: Secondary | ICD-10-CM | POA: Diagnosis not present

## 2021-02-17 DIAGNOSIS — M329 Systemic lupus erythematosus, unspecified: Secondary | ICD-10-CM | POA: Diagnosis not present

## 2021-02-17 HISTORY — PX: ESOPHAGOGASTRODUODENOSCOPY (EGD) WITH PROPOFOL: SHX5813

## 2021-02-17 HISTORY — PX: BALLOON DILATION: SHX5330

## 2021-02-17 HISTORY — PX: BIOPSY: SHX5522

## 2021-02-17 SURGERY — ESOPHAGOGASTRODUODENOSCOPY (EGD) WITH PROPOFOL
Anesthesia: General

## 2021-02-17 MED ORDER — LIDOCAINE HCL (CARDIAC) PF 100 MG/5ML IV SOSY
PREFILLED_SYRINGE | INTRAVENOUS | Status: DC | PRN
  Administered 2021-02-17: 40 mg via INTRATRACHEAL

## 2021-02-17 MED ORDER — PHENYLEPHRINE 40 MCG/ML (10ML) SYRINGE FOR IV PUSH (FOR BLOOD PRESSURE SUPPORT)
PREFILLED_SYRINGE | INTRAVENOUS | Status: DC | PRN
  Administered 2021-02-17: 80 ug via INTRAVENOUS

## 2021-02-17 MED ORDER — LACTATED RINGERS IV SOLN: INTRAVENOUS | Status: DC | PRN

## 2021-02-17 MED ORDER — PROPOFOL 10 MG/ML IV BOLUS
INTRAVENOUS | Status: DC | PRN
  Administered 2021-02-17: 20 mg via INTRAVENOUS
  Administered 2021-02-17: 40 mg via INTRAVENOUS
  Administered 2021-02-17: 80 mg via INTRAVENOUS

## 2021-02-17 MED ORDER — PANTOPRAZOLE SODIUM 40 MG PO TBEC: 40.0000 mg | DELAYED_RELEASE_TABLET | Freq: Two times a day (BID) | ORAL | 5 refills | Status: DC

## 2021-02-17 MED ORDER — STERILE WATER FOR IRRIGATION IR SOLN
Status: DC | PRN
  Administered 2021-02-17: 1.5 mL

## 2021-02-17 NOTE — Discharge Instructions (Addendum)
EGD Discharge instructions Please read the instructions outlined below and refer to this sheet in the next few weeks. These discharge instructions provide you with general information on caring for yourself after you leave the hospital. Your doctor may also give you specific instructions. While your treatment has been planned according to the most current medical practices available, unavoidable complications occasionally occur. If you have any problems or questions after discharge, please call your doctor. ACTIVITY  You may resume your regular activity but move at a slower pace for the next 24 hours.   Take frequent rest periods for the next 24 hours.   Walking will help expel (get rid of) the air and reduce the bloated feeling in your abdomen.   No driving for 24 hours (because of the anesthesia (medicine) used during the test).   You may shower.   Do not sign any important legal documents or operate any machinery for 24 hours (because of the anesthesia used during the test).  NUTRITION  Drink plenty of fluids.   You may resume your normal diet.   Begin with a light meal and progress to your normal diet.   Avoid alcoholic beverages for 24 hours or as instructed by your caregiver.  MEDICATIONS  You may resume your normal medications unless your caregiver tells you otherwise.  WHAT YOU CAN EXPECT TODAY  You may experience abdominal discomfort such as a feeling of fullness or "gas" pains.  FOLLOW-UP  Your doctor will discuss the results of your test with you.  SEEK IMMEDIATE MEDICAL ATTENTION IF ANY OF THE FOLLOWING OCCUR:  Excessive nausea (feeling sick to your stomach) and/or vomiting.   Severe abdominal pain and distention (swelling).   Trouble swallowing.   Temperature over 101 F (37.8 C).   Rectal bleeding or vomiting of blood.   Your EGD revealed a tightening in your esophagus which I dilated with a 20 mm balloon.  Hopefully this helps with your swallowing.  You  also had extensive inflammation in your stomach.  I took biopsies of this to rule out infection with a bacteria called H. pylori.  I want you to increase your Protonix to twice daily for 8 weeks and then can go back to once daily thereafter.  Avoid NSAIDs as best as you can.  Follow-up with GI in 3 months.   I hope you have a great rest of your week!  Elon Alas. Abbey Chatters, D.O. Gastroenterology and Hepatology [facility]

## 2021-02-17 NOTE — Transfer of Care (Signed)
Immediate Anesthesia Transfer of Care Note  Patient: Kristin Crosby  Procedure(s) Performed: ESOPHAGOGASTRODUODENOSCOPY (EGD) WITH PROPOFOL (N/A ) BALLOON DILATION (N/A ) BIOPSY  Patient Location: Endoscopy Unit  Anesthesia Type:MAC  Level of Consciousness: awake, alert , oriented and patient cooperative  Airway & Oxygen Therapy: Patient Spontanous Breathing and Patient connected to nasal cannula oxygen  Post-op Assessment: Report given to RN and Post -op Vital signs reviewed and stable  Post vital signs: Reviewed and stable  Last Vitals:  Vitals Value Taken Time  BP    Temp    Pulse    Resp    SpO2      Last Pain:  Vitals:   02/17/21 1048  TempSrc:   PainSc: 0-No pain      Patients Stated Pain Goal: 8 (27/63/94 3200)  Complications: No complications documented.

## 2021-02-17 NOTE — Op Note (Signed)
[facility] Patient Name: Kristin Crosby Procedure Date: 02/17/2021 10:37 AM MRN: [id number] Date of Birth: 1951-05-01 Attending MD: Elon Alas. Abbey Chatters DO CSN: [id number] Age: 70 Admit Type: Outpatient Procedure:                Upper GI endoscopy Indications:              Dysphagia, Melena Providers:                Elon Alas. Abbey Chatters, DO, Tacy Learn,                            Technician Referring MD:              Medicines:                See the Anesthesia note for documentation of the                            administered medications Complications:            No immediate complications. Estimated Blood Loss:     Estimated blood loss was minimal. Procedure:                Pre-Anesthesia Assessment:                           - The anesthesia plan was to use monitored                            anesthesia care (MAC).                           After obtaining informed consent, the endoscope was                            passed under direct vision. Throughout the                            procedure, the patient's blood pressure, pulse, and                            oxygen saturations were monitored continuously. The                            [id number]-[id number] ([id number]) was introduced through the                            mouth, and advanced to the second part of duodenum.                            The upper GI endoscopy was accomplished without                            difficulty. The patient tolerated the procedure                            well. Scope In: 10:50:58 AM Scope Out: 37:85:88  AM Total Procedure Duration: 0 hours 5 minutes 45 seconds  Findings:      One benign-appearing, intrinsic moderate stenosis was found in the lower       third of the esophagus. The stenosis was traversed. A TTS dilator was       passed through the scope. Dilation with an 18-19-20 mm balloon dilator       was performed to 20 mm. The dilation site was examined and showed mild        mucosal disruption and moderate improvement in luminal narrowing.      Diffuse moderate inflammation characterized by erosions and erythema was       found in the entire examined stomach. Biopsies were taken with a cold       forceps for Helicobacter pylori testing.      The duodenal bulb, first portion of the duodenum and second portion of       the duodenum were normal. Impression:               - Benign-appearing esophageal stenosis. Dilated.                           - Gastritis. Biopsied.                           - Normal duodenal bulb, first portion of the                            duodenum and second portion of the duodenum. Moderate Sedation:      Per Anesthesia Care Recommendation:           - Patient has a contact number available for                            emergencies. The signs and symptoms of potential                            delayed complications were discussed with the                            patient. Return to normal activities tomorrow.                            Written discharge instructions were provided to the                            patient.                           - Resume previous diet.                           - Continue present medications.                           - Await pathology results.                           - Repeat upper endoscopy PRN for retreatment.                           -  Use Protonix (pantoprazole) 40 mg PO BID for 8                            weeks then decrease back down to once daily.                           - No ibuprofen, naproxen, or other non-steroidal                            anti-inflammatory drugs.                           - Return to GI clinic in 3 months. Procedure Code(s):        --- Professional ---                           [phone], Esophagogastroduodenoscopy, flexible,                            transoral; with transendoscopic balloon dilation of                            esophagus (less than 30 mm  diameter)                           43239, 59, Esophagogastroduodenoscopy, flexible,                            transoral; with biopsy, single or multiple Diagnosis Code(s):        --- Professional ---                           K22.2, Esophageal obstruction                           K29.70, Gastritis, unspecified, without bleeding                           R13.10, Dysphagia, unspecified                           K92.1, Melena (includes Hematochezia) CPT copyright 2019 [employer]. All rights reserved. The codes documented in this report are preliminary and upon coder review may  be revised to meet current compliance requirements. Elon Alas. Abbey Chatters, DO [place], DO 02/17/2021 11:00:41 AM This report has been signed electronically. Number of Addenda: 0
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
## 2021-02-17 NOTE — Anesthesia Preprocedure Evaluation (Addendum)
Anesthesia Evaluation  Patient identified by MRN, date of birth, ID band Patient awake    Reviewed: Allergy & Precautions, H&P , NPO status , Patient's Chart, lab work & pertinent test results, reviewed documented beta blocker date and time   Airway Mallampati: II  TM Distance: >3 FB Neck ROM: full    Dental no notable dental hx.    Pulmonary neg pulmonary ROS, former smoker,    Pulmonary exam normal breath sounds clear to auscultation       Cardiovascular Exercise Tolerance: Good negative cardio ROS   Rhythm:regular Rate:Normal     Neuro/Psych PSYCHIATRIC DISORDERS Anxiety Depression  Neuromuscular disease    GI/Hepatic Neg liver ROS, GERD  Medicated,  Endo/Other  negative endocrine ROSdiabetes  Renal/GU negative Renal ROS  negative genitourinary   Musculoskeletal   Abdominal   Peds  Hematology  (+) Blood dyscrasia, anemia ,   Anesthesia Other Findings Lupus  Reproductive/Obstetrics negative OB ROS                             Anesthesia Physical Anesthesia Plan  ASA: III  Anesthesia Plan: General   Post-op Pain Management:    Induction:   PONV Risk Score and Plan: Propofol infusion  Airway Management Planned:   Additional Equipment:   Intra-op Plan:   Post-operative Plan:   Informed Consent: I have reviewed the patients History and Physical, chart, labs and discussed the procedure including the risks, benefits and alternatives for the proposed anesthesia with the patient or authorized representative who has indicated his/her understanding and acceptance.     Dental Advisory Given  Plan Discussed with: CRNA  Anesthesia Plan Comments:         Anesthesia Quick Evaluation

## 2021-02-17 NOTE — Anesthesia Postprocedure Evaluation (Signed)
Anesthesia Post Note  Patient: Kristin Crosby  Procedure(s) Performed: ESOPHAGOGASTRODUODENOSCOPY (EGD) WITH PROPOFOL (N/A ) BALLOON DILATION (N/A ) BIOPSY  Patient location during evaluation: Phase II Anesthesia Type: General Level of consciousness: awake Pain management: pain level controlled Vital Signs Assessment: post-procedure vital signs reviewed and stable Respiratory status: spontaneous breathing and respiratory function stable Cardiovascular status: blood pressure returned to baseline and stable Postop Assessment: no headache and no apparent nausea or vomiting Anesthetic complications: no Comments: Late entry   No complications documented.   Last Vitals:  Vitals:   02/17/21 1032 02/17/21 1100  BP: 112/63 (!) 95/41  Pulse: 91   Resp: 11 (!) 22  Temp: 36.7 C 36.7 C  SpO2: 96% 94%    Last Pain:  Vitals:   02/17/21 1100  TempSrc: Oral  PainSc: 0-No pain                 Louann Sjogren

## 2021-02-20 LAB — SURGICAL PATHOLOGY

## 2021-02-20 LAB — GLUCOSE, CAPILLARY: Glucose-Capillary: 89 mg/dL (ref 70–99)

## 2021-02-23 ENCOUNTER — Telehealth: Payer: Self-pay | Admitting: *Deleted

## 2021-02-23 NOTE — Telephone Encounter (Signed)
She had an Endo last Friday and was waiting on results.--Dr. Abbey Chatters did her procedure.  Can someone call her back please.   [phone]

## 2021-02-23 NOTE — Telephone Encounter (Signed)
Phoned and spoke with the pt and advised her that Dr. Abbey Chatters have not released her results to me yet but I will advise him that pt is wanting her results. Please advise

## 2021-02-27 ENCOUNTER — Encounter (HOSPITAL_COMMUNITY): Payer: Self-pay | Admitting: Internal Medicine

## 2021-02-27 ENCOUNTER — Telehealth: Payer: Self-pay | Admitting: Internal Medicine

## 2021-02-27 NOTE — Telephone Encounter (Signed)
I have not signed patient's pathology report because I am still waiting H. pylori status.  She has chronic gastritis and they make mention of that the H. pylori testing is pending and they were reported in an addendum though I do not see this addendum?  Can we call pathology department and see if this has finalized?  Thank you

## 2021-02-27 NOTE — Telephone Encounter (Signed)
Informed pt that a RX was sent on 02/17/2021 to [facility] in [place] with correct dosage.  Pt voiced understanding.

## 2021-02-27 NOTE — Telephone Encounter (Signed)
If patient pantoprozole changed , [facility] needs a new prescription,   patient told them we increased the dose

## 2021-02-28 NOTE — Telephone Encounter (Signed)
Phoned and spoke with the pt advised of the result note and when the rest of addendum comes in from Dr. Abbey Chatters we will call her

## 2021-03-02 DIAGNOSIS — Z1159 Encounter for screening for other viral diseases: Secondary | ICD-10-CM | POA: Diagnosis not present

## 2021-03-02 DIAGNOSIS — E782 Mixed hyperlipidemia: Secondary | ICD-10-CM | POA: Diagnosis not present

## 2021-03-02 DIAGNOSIS — I10 Essential (primary) hypertension: Secondary | ICD-10-CM | POA: Diagnosis not present

## 2021-03-02 DIAGNOSIS — K219 Gastro-esophageal reflux disease without esophagitis: Secondary | ICD-10-CM | POA: Diagnosis not present

## 2021-03-02 DIAGNOSIS — E1165 Type 2 diabetes mellitus with hyperglycemia: Secondary | ICD-10-CM | POA: Diagnosis not present

## 2021-03-02 DIAGNOSIS — G510 Bell's palsy: Secondary | ICD-10-CM | POA: Diagnosis not present

## 2021-03-02 DIAGNOSIS — E7849 Other hyperlipidemia: Secondary | ICD-10-CM | POA: Diagnosis not present

## 2021-03-02 DIAGNOSIS — J449 Chronic obstructive pulmonary disease, unspecified: Secondary | ICD-10-CM | POA: Diagnosis not present

## 2021-03-07 DIAGNOSIS — Z1389 Encounter for screening for other disorder: Secondary | ICD-10-CM | POA: Diagnosis not present

## 2021-03-07 DIAGNOSIS — I952 Hypotension due to drugs: Secondary | ICD-10-CM | POA: Diagnosis not present

## 2021-03-07 DIAGNOSIS — Z6825 Body mass index (BMI) 25.0-25.9, adult: Secondary | ICD-10-CM | POA: Diagnosis not present

## 2021-03-07 DIAGNOSIS — E119 Type 2 diabetes mellitus without complications: Secondary | ICD-10-CM | POA: Diagnosis not present

## 2021-03-07 DIAGNOSIS — R911 Solitary pulmonary nodule: Secondary | ICD-10-CM | POA: Diagnosis not present

## 2021-03-07 DIAGNOSIS — I700 Atherosclerosis of aorta: Secondary | ICD-10-CM | POA: Diagnosis not present

## 2021-03-07 DIAGNOSIS — Z1331 Encounter for screening for depression: Secondary | ICD-10-CM | POA: Diagnosis not present

## 2021-03-07 DIAGNOSIS — F5101 Primary insomnia: Secondary | ICD-10-CM | POA: Diagnosis not present

## 2021-03-07 DIAGNOSIS — J929 Pleural plaque without asbestos: Secondary | ICD-10-CM | POA: Diagnosis not present

## 2021-03-07 DIAGNOSIS — J432 Centrilobular emphysema: Secondary | ICD-10-CM | POA: Diagnosis not present

## 2021-03-08 DIAGNOSIS — M25511 Pain in right shoulder: Secondary | ICD-10-CM | POA: Diagnosis not present

## 2021-03-08 DIAGNOSIS — M8580 Other specified disorders of bone density and structure, unspecified site: Secondary | ICD-10-CM | POA: Diagnosis not present

## 2021-03-08 DIAGNOSIS — M3500 Sicca syndrome, unspecified: Secondary | ICD-10-CM | POA: Diagnosis not present

## 2021-03-08 DIAGNOSIS — E663 Overweight: Secondary | ICD-10-CM | POA: Diagnosis not present

## 2021-03-08 DIAGNOSIS — Z6825 Body mass index (BMI) 25.0-25.9, adult: Secondary | ICD-10-CM | POA: Diagnosis not present

## 2021-03-13 DIAGNOSIS — F330 Major depressive disorder, recurrent, mild: Secondary | ICD-10-CM | POA: Diagnosis not present

## 2021-03-16 DIAGNOSIS — Z87891 Personal history of nicotine dependence: Secondary | ICD-10-CM | POA: Diagnosis not present

## 2021-03-16 DIAGNOSIS — I110 Hypertensive heart disease with heart failure: Secondary | ICD-10-CM | POA: Diagnosis not present

## 2021-03-16 DIAGNOSIS — E1165 Type 2 diabetes mellitus with hyperglycemia: Secondary | ICD-10-CM | POA: Diagnosis not present

## 2021-03-16 DIAGNOSIS — J441 Chronic obstructive pulmonary disease with (acute) exacerbation: Secondary | ICD-10-CM | POA: Diagnosis not present

## 2021-03-16 DIAGNOSIS — I5022 Chronic systolic (congestive) heart failure: Secondary | ICD-10-CM | POA: Diagnosis not present

## 2021-03-22 ENCOUNTER — Telehealth: Payer: Self-pay

## 2021-03-22 NOTE — Telephone Encounter (Signed)
[facility] in [place] called and pt requested that her Pantoprazole be sent to [facility]. They have her taking it once a day instead of 2 a day.

## 2021-03-23 ENCOUNTER — Telehealth: Payer: Self-pay | Admitting: Internal Medicine

## 2021-03-23 DIAGNOSIS — Z79899 Other long term (current) drug therapy: Secondary | ICD-10-CM | POA: Diagnosis not present

## 2021-03-23 MED ORDER — PANTOPRAZOLE SODIUM 40 MG PO TBEC: 40.0000 mg | DELAYED_RELEASE_TABLET | Freq: Two times a day (BID) | ORAL | 5 refills | Status: DC

## 2021-03-23 NOTE — Telephone Encounter (Signed)
Pantoprazole 40 mg twice daily sent to [facility].  Thanks

## 2021-03-23 NOTE — Telephone Encounter (Signed)
Pt aware medication has been sent to [facility]

## 2021-03-30 DIAGNOSIS — Z23 Encounter for immunization: Secondary | ICD-10-CM | POA: Diagnosis not present

## 2021-03-30 NOTE — Telephone Encounter (Signed)
Phoned and spoke with the pt advised of her results and that Dr. Abbey Chatters had been waiting on addendum though he hasn't seen one yet. If anything comes across to [place] we will call the pt and advise. The pt was ok with the report.

## 2021-04-06 IMAGING — CT NUCLEAR MEDICINE PET IMAGE INITIAL (PI) SKULL BASE TO THIGH
1 of 8 series · 3 of 16 positions shown, 4 images · non-contrast
Comparison: None

CLINICAL DATA: Initial treatment strategy for RIGHT breast
neoplasm. LEFT breast lymphoma

EXAM:
NUCLEAR MEDICINE PET SKULL BASE TO THIGH
TECHNIQUE: 8.9 mCi F-18 FDG was injected intravenously. Full-ring PET imaging
was performed from the skull base to thigh after the radiotracer. CT
data was obtained and used for attenuation correction and anatomic
localization.
Fasting blood glucose: 82 mg/dl

[Series 4: ct sk_thigh 5.0 b31f · axial · 0.98mm/px · z∈[-708,+172]mm · 3 of 221 slices shown, 4 images]
[im 1/221  soft-tissue]
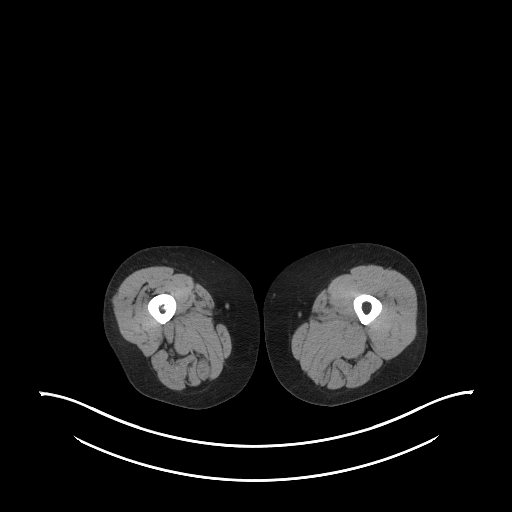
[im 1/221  bone]
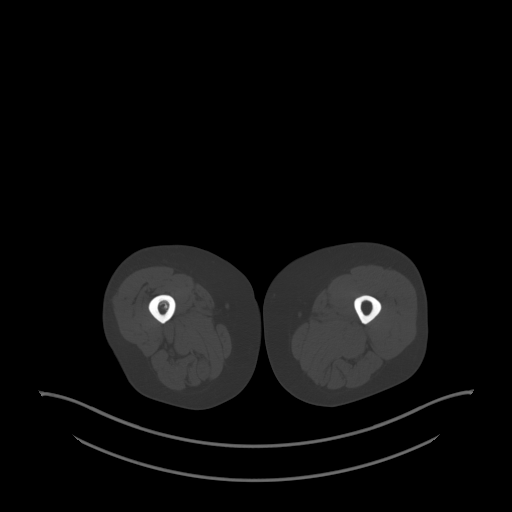
[im 111/221  soft-tissue]
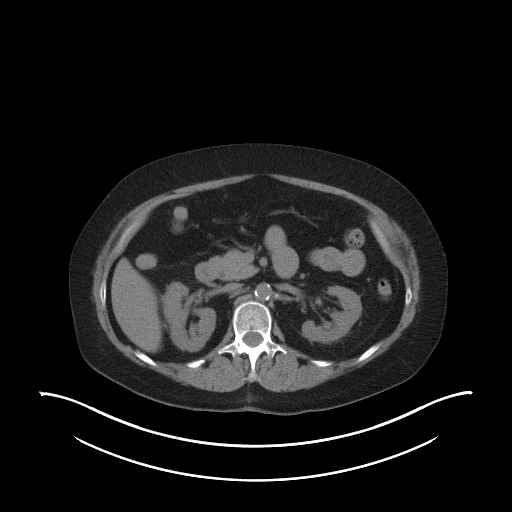
[im 221/221  soft-tissue]
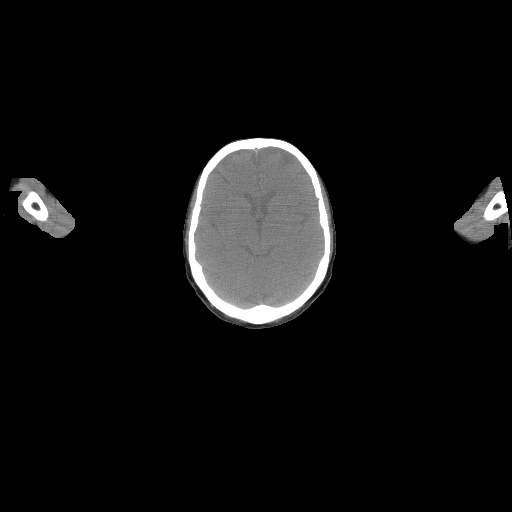

[3 of 16 positions shown; findings below may reference images not displayed]

FINDINGS: Mediastinal blood pool activity: SUV max

NECK: No hypermetabolic lymph nodes in the neck.

Incidental CT findings: none

CHEST: Mild activity associated with small nodular lesions in the
LEFT breast with SUV max equal 2.4. The small nodular lesions are
associated with 2 biopsy clips.

No hypermetabolic mediastinal or axillary lymph nodes. No
hypermetabolic hilar lymph nodes. No suspicious nodules.

One nodule in the RIGHT middle lobe measures 5 mm (image 71/4) but
does not have associated metabolic activity.

Incidental CT findings: none

ABDOMEN/PELVIS: No abnormal hypermetabolic activity within the
liver, pancreas, adrenal glands, or spleen. No hypermetabolic lymph
nodes in the abdomen or pelvis.

Incidental CT findings: Spleen normal volume. No hypermetabolic
adenopathy.

SKELETON: No focal hypermetabolic activity to suggest skeletal
metastasis.

Incidental CT findings: none
IMPRESSION: 1. Mild activity associated with LEFT breast nodules adjacent to
biopsy clip.
2. No evidence of hypermetabolic adenopathy in the chest, abdomen,
or pelvis.
3. Normal spleen and bone marrow.
4. No evidence of metastatic disease.

## 2021-04-16 DIAGNOSIS — I110 Hypertensive heart disease with heart failure: Secondary | ICD-10-CM | POA: Diagnosis not present

## 2021-04-16 DIAGNOSIS — Z87891 Personal history of nicotine dependence: Secondary | ICD-10-CM | POA: Diagnosis not present

## 2021-04-16 DIAGNOSIS — E1165 Type 2 diabetes mellitus with hyperglycemia: Secondary | ICD-10-CM | POA: Diagnosis not present

## 2021-04-16 DIAGNOSIS — J441 Chronic obstructive pulmonary disease with (acute) exacerbation: Secondary | ICD-10-CM | POA: Diagnosis not present

## 2021-04-16 DIAGNOSIS — I5022 Chronic systolic (congestive) heart failure: Secondary | ICD-10-CM | POA: Diagnosis not present

## 2021-04-19 ENCOUNTER — Ambulatory Visit: Payer: HMO | Admitting: Cardiology

## 2021-04-19 VITALS — BP 108/68 | HR 82 | Resp 18 | Ht 64.0 in | Wt 147.2 lb

## 2021-04-19 DIAGNOSIS — I5022 Chronic systolic (congestive) heart failure: Secondary | ICD-10-CM
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
## 2021-04-19 NOTE — Patient Instructions (Addendum)
Medication Instructions:  Your physician recommends that you continue on your current medications as directed. Please refer to the Current Medication list given to you today.  Labwork: none  Testing/Procedures: none  Follow-Up: Your physician recommends that you schedule a follow-up appointment in: 1 year. You will receive a reminder call letter in the mail in about 10 months reminding you to call and schedule your appointment. If you don't receive this letter, please contact our office.  Any Other Special Instructions Will Be Listed Below (If Applicable).  If you need a refill on your cardiac medications before your next appointment, please call your pharmacy.

## 2021-04-24 ENCOUNTER — Ambulatory Visit: Payer: HMO | Admitting: Cardiology

## 2021-05-17 DIAGNOSIS — J441 Chronic obstructive pulmonary disease with (acute) exacerbation: Secondary | ICD-10-CM | POA: Diagnosis not present

## 2021-05-17 DIAGNOSIS — Z87891 Personal history of nicotine dependence: Secondary | ICD-10-CM | POA: Diagnosis not present

## 2021-05-17 DIAGNOSIS — I5022 Chronic systolic (congestive) heart failure: Secondary | ICD-10-CM | POA: Diagnosis not present

## 2021-05-17 DIAGNOSIS — I110 Hypertensive heart disease with heart failure: Secondary | ICD-10-CM | POA: Diagnosis not present

## 2021-05-17 DIAGNOSIS — E1165 Type 2 diabetes mellitus with hyperglycemia: Secondary | ICD-10-CM | POA: Diagnosis not present

## 2021-05-24 ENCOUNTER — Other Ambulatory Visit: Payer: Self-pay | Admitting: Physician Assistant

## 2021-06-05 DIAGNOSIS — G8929 Other chronic pain: Secondary | ICD-10-CM | POA: Diagnosis not present

## 2021-06-05 DIAGNOSIS — E1122 Type 2 diabetes mellitus with diabetic chronic kidney disease: Secondary | ICD-10-CM | POA: Diagnosis not present

## 2021-06-05 DIAGNOSIS — G59 Mononeuropathy in diseases classified elsewhere: Secondary | ICD-10-CM | POA: Diagnosis not present

## 2021-06-05 DIAGNOSIS — M25511 Pain in right shoulder: Secondary | ICD-10-CM | POA: Diagnosis not present

## 2021-06-05 DIAGNOSIS — D692 Other nonthrombocytopenic purpura: Secondary | ICD-10-CM | POA: Diagnosis not present

## 2021-06-16 DIAGNOSIS — I5022 Chronic systolic (congestive) heart failure: Secondary | ICD-10-CM | POA: Diagnosis not present

## 2021-06-16 DIAGNOSIS — J441 Chronic obstructive pulmonary disease with (acute) exacerbation: Secondary | ICD-10-CM | POA: Diagnosis not present

## 2021-06-16 DIAGNOSIS — Z87891 Personal history of nicotine dependence: Secondary | ICD-10-CM | POA: Diagnosis not present

## 2021-06-16 DIAGNOSIS — I110 Hypertensive heart disease with heart failure: Secondary | ICD-10-CM | POA: Diagnosis not present

## 2021-06-16 DIAGNOSIS — E1165 Type 2 diabetes mellitus with hyperglycemia: Secondary | ICD-10-CM | POA: Diagnosis not present

## 2021-06-22 DIAGNOSIS — E8809 Other disorders of plasma-protein metabolism, not elsewhere classified: Secondary | ICD-10-CM | POA: Insufficient documentation

## 2021-06-27 ENCOUNTER — Ambulatory Visit: Payer: HMO | Admitting: Gastroenterology

## 2021-07-04 DIAGNOSIS — Z23 Encounter for immunization: Secondary | ICD-10-CM | POA: Diagnosis not present

## 2021-07-17 DIAGNOSIS — E1165 Type 2 diabetes mellitus with hyperglycemia: Secondary | ICD-10-CM | POA: Diagnosis not present

## 2021-07-17 DIAGNOSIS — J441 Chronic obstructive pulmonary disease with (acute) exacerbation: Secondary | ICD-10-CM | POA: Diagnosis not present

## 2021-07-17 DIAGNOSIS — Z87891 Personal history of nicotine dependence: Secondary | ICD-10-CM | POA: Diagnosis not present

## 2021-07-17 DIAGNOSIS — I110 Hypertensive heart disease with heart failure: Secondary | ICD-10-CM | POA: Diagnosis not present

## 2021-07-17 DIAGNOSIS — I5022 Chronic systolic (congestive) heart failure: Secondary | ICD-10-CM | POA: Diagnosis not present

## 2021-08-09 ENCOUNTER — Encounter: Payer: Self-pay | Admitting: Internal Medicine

## 2021-08-09 ENCOUNTER — Ambulatory Visit: Payer: HMO | Admitting: Internal Medicine

## 2021-08-09 ENCOUNTER — Other Ambulatory Visit: Payer: Self-pay

## 2021-08-09 VITALS — BP 100/51 | HR 84 | Temp 97.1°F | Ht 64.0 in | Wt 143.0 lb

## 2021-08-09 DIAGNOSIS — K5900 Constipation, unspecified: Secondary | ICD-10-CM

## 2021-08-09 DIAGNOSIS — K219 Gastro-esophageal reflux disease without esophagitis: Secondary | ICD-10-CM
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
## 2021-08-09 NOTE — Patient Instructions (Signed)
For your chronic reflux, continue on pantoprazole 40 mg.  You can take this medication twice daily if needed.  For your constipation, I want you to start taking over the counter MiraLAX 1 capful daily.  If this does not adequately control your constipation, I would increase to 1 capful twice daily.  You can also add Dulcolax over-the-counter once every 2 to 3 days as needed.   I also recommend increasing fiber in your diet or adding OTC Benefiber/Metamucil. Be sure to drink at least 4 to 6 glasses of water daily.   Follow-up in 3 to 4 months.  It was great seeing you again today.  I hope you have a great Thanksgiving and Christmas.  Dr. Abbey Chatters  At [facility] we value your feedback. You may receive a survey about your visit today. Please share your experience as we strive to create trusting relationships with our patients to provide genuine, compassionate, quality care.  We appreciate your understanding and patience as we review any laboratory studies, imaging, and other diagnostic tests that are ordered as we care for you. Our office policy is 5 business days for review of these results, and any emergent or urgent results are addressed in a timely manner for your best interest. If you do not hear from our office in 1 week, please contact us.   We also encourage the use of MyChart, which contains your medical information for your review as well. If you are not enrolled in this feature, an access code is on this after visit summary for your convenience. Thank you for allowing [place] to be involved in your care.  It was great to see you today!  I hope you have a great rest of your Fall!    Elon Alas. Abbey Chatters, D.O. Gastroenterology and Hepatology [facility]

## 2021-08-29 DIAGNOSIS — Z131 Encounter for screening for diabetes mellitus: Secondary | ICD-10-CM | POA: Diagnosis not present

## 2021-09-11 DIAGNOSIS — F5105 Insomnia due to other mental disorder: Secondary | ICD-10-CM | POA: Diagnosis not present

## 2021-09-11 DIAGNOSIS — F339 Major depressive disorder, recurrent, unspecified: Secondary | ICD-10-CM | POA: Diagnosis not present

## 2021-09-11 DIAGNOSIS — F99 Mental disorder, not otherwise specified: Secondary | ICD-10-CM | POA: Diagnosis not present

## 2021-09-11 DIAGNOSIS — F419 Anxiety disorder, unspecified: Secondary | ICD-10-CM | POA: Diagnosis not present

## 2021-09-11 DIAGNOSIS — E119 Type 2 diabetes mellitus without complications: Secondary | ICD-10-CM | POA: Diagnosis not present

## 2021-09-12 ENCOUNTER — Encounter: Payer: Self-pay | Admitting: Cardiology

## 2021-09-12 DIAGNOSIS — M3500 Sicca syndrome, unspecified: Secondary | ICD-10-CM | POA: Diagnosis not present

## 2021-09-12 DIAGNOSIS — Z6824 Body mass index (BMI) 24.0-24.9, adult: Secondary | ICD-10-CM | POA: Diagnosis not present

## 2021-09-12 DIAGNOSIS — M8580 Other specified disorders of bone density and structure, unspecified site: Secondary | ICD-10-CM | POA: Diagnosis not present

## 2021-09-12 DIAGNOSIS — M25511 Pain in right shoulder: Secondary | ICD-10-CM | POA: Diagnosis not present

## 2021-09-13 ENCOUNTER — Other Ambulatory Visit: Payer: Self-pay | Admitting: Internal Medicine

## 2021-09-13 ENCOUNTER — Telehealth: Payer: Self-pay | Admitting: Cardiology

## 2021-09-13 NOTE — Telephone Encounter (Signed)
Noted will fwd to provider. Pt has appt with provider on 12/29

## 2021-09-13 NOTE — Telephone Encounter (Signed)
Pt c/o BP issue: STAT if pt c/o blurred vision, one-sided weakness or slurred speech  1. What are your last 5 BP readings? 108/46 this morning, yesterday 117/48, 109/56, 112/45, 114/60  2. Are you having any other symptoms (ex. Dizziness, headache, blurred vision, passed out)? Antidepressant has been making her groggy so not sure if fatigue is from that  3. What is your BP issue? Patient states she saw her PCP yesterday and they said her BP was way too low. She has an appointment tomorrow with Dr. Harl Bowie.

## 2021-09-14 ENCOUNTER — Ambulatory Visit: Payer: HMO | Admitting: Cardiology

## 2021-09-14 ENCOUNTER — Encounter: Payer: Self-pay | Admitting: Cardiology

## 2021-09-14 ENCOUNTER — Other Ambulatory Visit: Payer: Self-pay

## 2021-09-14 VITALS — BP 98/58 | HR 80 | Ht 64.0 in | Wt 143.8 lb

## 2021-09-14 DIAGNOSIS — I5022 Chronic systolic (congestive) heart failure: Secondary | ICD-10-CM | POA: Diagnosis not present

## 2021-09-14 NOTE — Patient Instructions (Addendum)
Medication Instructions:  Stop Aspirin. Stop Lisinopril.  Continue all other medications.     Labwork: none  Testing/Procedures: none  Follow-Up: 6 months   Any Other Special Instructions Will Be Listed Below (If Applicable). Please call the office on Tuesday with update on blood pressure readings.   If you need a refill on your cardiac medications before your next appointment, please call your pharmacy.

## 2021-09-14 NOTE — Telephone Encounter (Signed)
Addressed in clinic today   Zandra Abts MD
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
## 2021-09-15 ENCOUNTER — Encounter: Payer: Self-pay | Admitting: *Deleted

## 2021-09-17 HISTORY — PX: OTHER SURGICAL HISTORY: SHX169

## 2021-09-19 ENCOUNTER — Telehealth: Payer: Self-pay | Admitting: Cardiology

## 2021-09-19 NOTE — Telephone Encounter (Signed)
Noted BP readings, I will forward to Dr.Branch

## 2021-09-19 NOTE — Telephone Encounter (Signed)
Pt c/o BP issue: STAT if pt c/o blurred vision, one-sided weakness or slurred speech  1. What are your last 5 BP readings?  09/15/21 113/49 HR 76 09/16/21 116/50 HR 76 09/17/21 108/53 HR 83 09/18/21 102/48 HR 75 09/19/21 114/50 HR 79  2. Are you having any other symptoms (ex. Dizziness, headache, blurred vision, passed out)? Dizziness   3. What is your BP issue? Dr. Harl Bowie requested she record BP readings due to being taken off lisinopril.

## 2021-09-20 ENCOUNTER — Encounter: Payer: Self-pay | Admitting: *Deleted

## 2021-09-22 DIAGNOSIS — M25511 Pain in right shoulder: Secondary | ICD-10-CM | POA: Diagnosis not present

## 2021-09-22 MED ORDER — CARVEDILOL 6.25 MG PO TABS: 6.2500 mg | ORAL_TABLET | Freq: Two times a day (BID) | ORAL | 3 refills | Status: DC

## 2021-09-22 NOTE — Telephone Encounter (Signed)
BPs look fine but if still having dizziness can lower coreg to 6.25mg  bid  Zandra Abts MD

## 2021-09-22 NOTE — Telephone Encounter (Signed)
Attempt to each, left message to return call.

## 2021-09-22 NOTE — Telephone Encounter (Signed)
Spoke to pt who confirms that she is still having bouts of dizziness. Pt verbalized understanding in lowering coreg to 6.25 mg tablets twice daily. Medication changes reflected on medication list.

## 2021-09-25 DIAGNOSIS — R52 Pain, unspecified: Secondary | ICD-10-CM | POA: Diagnosis not present

## 2021-09-25 DIAGNOSIS — S82852A Displaced trimalleolar fracture of left lower leg, initial encounter for closed fracture: Secondary | ICD-10-CM | POA: Diagnosis not present

## 2021-09-25 DIAGNOSIS — S82842D Displaced bimalleolar fracture of left lower leg, subsequent encounter for closed fracture with routine healing: Secondary | ICD-10-CM | POA: Diagnosis not present

## 2021-09-25 DIAGNOSIS — S82432A Displaced oblique fracture of shaft of left fibula, initial encounter for closed fracture: Secondary | ICD-10-CM | POA: Diagnosis not present

## 2021-09-25 DIAGNOSIS — Z9181 History of falling: Secondary | ICD-10-CM | POA: Diagnosis not present

## 2021-09-25 DIAGNOSIS — S8252XA Displaced fracture of medial malleolus of left tibia, initial encounter for closed fracture: Secondary | ICD-10-CM | POA: Diagnosis not present

## 2021-09-25 DIAGNOSIS — E119 Type 2 diabetes mellitus without complications: Secondary | ICD-10-CM | POA: Diagnosis not present

## 2021-09-25 DIAGNOSIS — S82832A Other fracture of upper and lower end of left fibula, initial encounter for closed fracture: Secondary | ICD-10-CM | POA: Diagnosis not present

## 2021-09-25 DIAGNOSIS — S8292XA Unspecified fracture of left lower leg, initial encounter for closed fracture: Secondary | ICD-10-CM | POA: Diagnosis not present

## 2021-09-25 DIAGNOSIS — Z7982 Long term (current) use of aspirin: Secondary | ICD-10-CM | POA: Diagnosis not present

## 2021-09-25 DIAGNOSIS — Z7409 Other reduced mobility: Secondary | ICD-10-CM | POA: Diagnosis not present

## 2021-09-25 DIAGNOSIS — R262 Difficulty in walking, not elsewhere classified: Secondary | ICD-10-CM | POA: Diagnosis not present

## 2021-09-25 DIAGNOSIS — R531 Weakness: Secondary | ICD-10-CM | POA: Diagnosis not present

## 2021-09-25 DIAGNOSIS — Z87891 Personal history of nicotine dependence: Secondary | ICD-10-CM | POA: Diagnosis not present

## 2021-09-25 DIAGNOSIS — R2689 Other abnormalities of gait and mobility: Secondary | ICD-10-CM | POA: Diagnosis not present

## 2021-09-25 DIAGNOSIS — W19XXXA Unspecified fall, initial encounter: Secondary | ICD-10-CM | POA: Diagnosis not present

## 2021-09-25 DIAGNOSIS — Z79899 Other long term (current) drug therapy: Secondary | ICD-10-CM | POA: Diagnosis not present

## 2021-09-25 DIAGNOSIS — W010XXA Fall on same level from slipping, tripping and stumbling without subsequent striking against object, initial encounter: Secondary | ICD-10-CM | POA: Diagnosis not present

## 2021-09-25 DIAGNOSIS — I509 Heart failure, unspecified: Secondary | ICD-10-CM | POA: Diagnosis not present

## 2021-09-25 DIAGNOSIS — F411 Generalized anxiety disorder: Secondary | ICD-10-CM | POA: Diagnosis not present

## 2021-09-25 DIAGNOSIS — R42 Dizziness and giddiness: Secondary | ICD-10-CM | POA: Diagnosis not present

## 2021-09-25 DIAGNOSIS — S8262XA Displaced fracture of lateral malleolus of left fibula, initial encounter for closed fracture: Secondary | ICD-10-CM | POA: Diagnosis not present

## 2021-09-25 DIAGNOSIS — F4312 Post-traumatic stress disorder, chronic: Secondary | ICD-10-CM | POA: Diagnosis not present

## 2021-09-25 DIAGNOSIS — J449 Chronic obstructive pulmonary disease, unspecified: Secondary | ICD-10-CM | POA: Diagnosis not present

## 2021-09-25 DIAGNOSIS — S82855A Nondisplaced trimalleolar fracture of left lower leg, initial encounter for closed fracture: Secondary | ICD-10-CM | POA: Diagnosis not present

## 2021-09-25 DIAGNOSIS — R000 Tachycardia, unspecified: Secondary | ICD-10-CM | POA: Diagnosis not present

## 2021-09-25 DIAGNOSIS — R0689 Other abnormalities of breathing: Secondary | ICD-10-CM | POA: Diagnosis not present

## 2021-09-25 DIAGNOSIS — Z885 Allergy status to narcotic agent status: Secondary | ICD-10-CM | POA: Diagnosis not present

## 2021-09-26 DIAGNOSIS — R1013 Epigastric pain: Secondary | ICD-10-CM | POA: Diagnosis not present

## 2021-09-26 DIAGNOSIS — Z7189 Other specified counseling: Secondary | ICD-10-CM | POA: Diagnosis not present

## 2021-09-26 DIAGNOSIS — E8809 Other disorders of plasma-protein metabolism, not elsewhere classified: Secondary | ICD-10-CM | POA: Diagnosis not present

## 2021-09-26 DIAGNOSIS — R911 Solitary pulmonary nodule: Secondary | ICD-10-CM | POA: Diagnosis not present

## 2021-09-28 DIAGNOSIS — C8590 Non-Hodgkin lymphoma, unspecified, unspecified site: Secondary | ICD-10-CM | POA: Diagnosis not present

## 2021-09-28 DIAGNOSIS — S9304XA Dislocation of right ankle joint, initial encounter: Secondary | ICD-10-CM | POA: Diagnosis not present

## 2021-09-28 DIAGNOSIS — I110 Hypertensive heart disease with heart failure: Secondary | ICD-10-CM | POA: Diagnosis not present

## 2021-09-28 DIAGNOSIS — W010XXD Fall on same level from slipping, tripping and stumbling without subsequent striking against object, subsequent encounter: Secondary | ICD-10-CM | POA: Diagnosis not present

## 2021-09-28 DIAGNOSIS — M6281 Muscle weakness (generalized): Secondary | ICD-10-CM | POA: Diagnosis not present

## 2021-09-28 DIAGNOSIS — Z87891 Personal history of nicotine dependence: Secondary | ICD-10-CM | POA: Diagnosis not present

## 2021-09-28 DIAGNOSIS — M3501 Sicca syndrome with keratoconjunctivitis: Secondary | ICD-10-CM | POA: Diagnosis not present

## 2021-09-28 DIAGNOSIS — F411 Generalized anxiety disorder: Secondary | ICD-10-CM | POA: Diagnosis not present

## 2021-09-28 DIAGNOSIS — E1140 Type 2 diabetes mellitus with diabetic neuropathy, unspecified: Secondary | ICD-10-CM | POA: Diagnosis not present

## 2021-09-28 DIAGNOSIS — R262 Difficulty in walking, not elsewhere classified: Secondary | ICD-10-CM | POA: Diagnosis not present

## 2021-09-28 DIAGNOSIS — Z79891 Long term (current) use of opiate analgesic: Secondary | ICD-10-CM | POA: Diagnosis not present

## 2021-09-28 DIAGNOSIS — I509 Heart failure, unspecified: Secondary | ICD-10-CM | POA: Diagnosis not present

## 2021-09-28 DIAGNOSIS — Z7952 Long term (current) use of systemic steroids: Secondary | ICD-10-CM | POA: Diagnosis not present

## 2021-09-28 DIAGNOSIS — F429 Obsessive-compulsive disorder, unspecified: Secondary | ICD-10-CM | POA: Diagnosis not present

## 2021-09-28 DIAGNOSIS — E119 Type 2 diabetes mellitus without complications: Secondary | ICD-10-CM | POA: Diagnosis not present

## 2021-09-28 DIAGNOSIS — F329 Major depressive disorder, single episode, unspecified: Secondary | ICD-10-CM | POA: Diagnosis not present

## 2021-09-28 DIAGNOSIS — S82852D Displaced trimalleolar fracture of left lower leg, subsequent encounter for closed fracture with routine healing: Secondary | ICD-10-CM | POA: Diagnosis not present

## 2021-09-28 DIAGNOSIS — Z993 Dependence on wheelchair: Secondary | ICD-10-CM | POA: Diagnosis not present

## 2021-09-28 DIAGNOSIS — Z7969 Long term (current) use of other immunomodulators and immunosuppressants: Secondary | ICD-10-CM | POA: Diagnosis not present

## 2021-09-28 DIAGNOSIS — Z9181 History of falling: Secondary | ICD-10-CM | POA: Diagnosis not present

## 2021-09-28 DIAGNOSIS — F4312 Post-traumatic stress disorder, chronic: Secondary | ICD-10-CM | POA: Diagnosis not present

## 2021-09-28 DIAGNOSIS — J449 Chronic obstructive pulmonary disease, unspecified: Secondary | ICD-10-CM | POA: Diagnosis not present

## 2021-10-09 DIAGNOSIS — M533 Sacrococcygeal disorders, not elsewhere classified: Secondary | ICD-10-CM | POA: Diagnosis not present

## 2021-10-09 DIAGNOSIS — Z01818 Encounter for other preprocedural examination: Secondary | ICD-10-CM | POA: Diagnosis not present

## 2021-10-09 DIAGNOSIS — Z01812 Encounter for preprocedural laboratory examination: Secondary | ICD-10-CM | POA: Diagnosis not present

## 2021-10-10 DIAGNOSIS — Z8572 Personal history of non-Hodgkin lymphomas: Secondary | ICD-10-CM | POA: Diagnosis not present

## 2021-10-10 DIAGNOSIS — F429 Obsessive-compulsive disorder, unspecified: Secondary | ICD-10-CM | POA: Diagnosis not present

## 2021-10-10 DIAGNOSIS — M329 Systemic lupus erythematosus, unspecified: Secondary | ICD-10-CM | POA: Diagnosis not present

## 2021-10-10 DIAGNOSIS — F419 Anxiety disorder, unspecified: Secondary | ICD-10-CM | POA: Diagnosis not present

## 2021-10-10 DIAGNOSIS — F329 Major depressive disorder, single episode, unspecified: Secondary | ICD-10-CM | POA: Diagnosis not present

## 2021-10-10 DIAGNOSIS — N182 Chronic kidney disease, stage 2 (mild): Secondary | ICD-10-CM | POA: Diagnosis not present

## 2021-10-10 DIAGNOSIS — E1122 Type 2 diabetes mellitus with diabetic chronic kidney disease: Secondary | ICD-10-CM | POA: Diagnosis not present

## 2021-10-10 DIAGNOSIS — E7800 Pure hypercholesterolemia, unspecified: Secondary | ICD-10-CM | POA: Diagnosis not present

## 2021-10-10 DIAGNOSIS — F4312 Post-traumatic stress disorder, chronic: Secondary | ICD-10-CM | POA: Diagnosis not present

## 2021-10-10 DIAGNOSIS — Z9049 Acquired absence of other specified parts of digestive tract: Secondary | ICD-10-CM | POA: Diagnosis not present

## 2021-10-10 DIAGNOSIS — S82852A Displaced trimalleolar fracture of left lower leg, initial encounter for closed fracture: Secondary | ICD-10-CM | POA: Diagnosis not present

## 2021-10-10 DIAGNOSIS — Z7982 Long term (current) use of aspirin: Secondary | ICD-10-CM | POA: Diagnosis not present

## 2021-10-10 DIAGNOSIS — Z87891 Personal history of nicotine dependence: Secondary | ICD-10-CM | POA: Diagnosis not present

## 2021-10-10 DIAGNOSIS — Z885 Allergy status to narcotic agent status: Secondary | ICD-10-CM | POA: Diagnosis not present

## 2021-10-10 DIAGNOSIS — Z7952 Long term (current) use of systemic steroids: Secondary | ICD-10-CM | POA: Diagnosis not present

## 2021-10-10 DIAGNOSIS — G8918 Other acute postprocedural pain: Secondary | ICD-10-CM | POA: Diagnosis not present

## 2021-10-10 DIAGNOSIS — D649 Anemia, unspecified: Secondary | ICD-10-CM | POA: Diagnosis not present

## 2021-10-10 DIAGNOSIS — M797 Fibromyalgia: Secondary | ICD-10-CM | POA: Diagnosis not present

## 2021-10-10 DIAGNOSIS — G4700 Insomnia, unspecified: Secondary | ICD-10-CM | POA: Diagnosis not present

## 2021-10-10 DIAGNOSIS — E1140 Type 2 diabetes mellitus with diabetic neuropathy, unspecified: Secondary | ICD-10-CM | POA: Diagnosis not present

## 2021-10-10 DIAGNOSIS — K219 Gastro-esophageal reflux disease without esophagitis: Secondary | ICD-10-CM | POA: Diagnosis not present

## 2021-10-10 DIAGNOSIS — J449 Chronic obstructive pulmonary disease, unspecified: Secondary | ICD-10-CM | POA: Diagnosis not present

## 2021-10-10 DIAGNOSIS — H16229 Keratoconjunctivitis sicca, not specified as Sjogren's, unspecified eye: Secondary | ICD-10-CM | POA: Diagnosis not present

## 2021-10-10 DIAGNOSIS — I509 Heart failure, unspecified: Secondary | ICD-10-CM | POA: Diagnosis not present

## 2021-10-11 DIAGNOSIS — Z7969 Long term (current) use of other immunomodulators and immunosuppressants: Secondary | ICD-10-CM | POA: Diagnosis not present

## 2021-10-11 DIAGNOSIS — F411 Generalized anxiety disorder: Secondary | ICD-10-CM | POA: Diagnosis not present

## 2021-10-11 DIAGNOSIS — E119 Type 2 diabetes mellitus without complications: Secondary | ICD-10-CM | POA: Diagnosis not present

## 2021-10-11 DIAGNOSIS — Z7952 Long term (current) use of systemic steroids: Secondary | ICD-10-CM | POA: Diagnosis not present

## 2021-10-11 DIAGNOSIS — M6281 Muscle weakness (generalized): Secondary | ICD-10-CM | POA: Diagnosis not present

## 2021-10-11 DIAGNOSIS — S82852D Displaced trimalleolar fracture of left lower leg, subsequent encounter for closed fracture with routine healing: Secondary | ICD-10-CM | POA: Diagnosis not present

## 2021-10-11 DIAGNOSIS — C8590 Non-Hodgkin lymphoma, unspecified, unspecified site: Secondary | ICD-10-CM | POA: Diagnosis not present

## 2021-10-11 DIAGNOSIS — E1140 Type 2 diabetes mellitus with diabetic neuropathy, unspecified: Secondary | ICD-10-CM | POA: Diagnosis not present

## 2021-10-11 DIAGNOSIS — F429 Obsessive-compulsive disorder, unspecified: Secondary | ICD-10-CM | POA: Diagnosis not present

## 2021-10-11 DIAGNOSIS — Z79891 Long term (current) use of opiate analgesic: Secondary | ICD-10-CM | POA: Diagnosis not present

## 2021-10-11 DIAGNOSIS — W010XXD Fall on same level from slipping, tripping and stumbling without subsequent striking against object, subsequent encounter: Secondary | ICD-10-CM | POA: Diagnosis not present

## 2021-10-11 DIAGNOSIS — I110 Hypertensive heart disease with heart failure: Secondary | ICD-10-CM | POA: Diagnosis not present

## 2021-10-11 DIAGNOSIS — Z87891 Personal history of nicotine dependence: Secondary | ICD-10-CM | POA: Diagnosis not present

## 2021-10-11 DIAGNOSIS — M3501 Sicca syndrome with keratoconjunctivitis: Secondary | ICD-10-CM | POA: Diagnosis not present

## 2021-10-11 DIAGNOSIS — S9304XA Dislocation of right ankle joint, initial encounter: Secondary | ICD-10-CM | POA: Diagnosis not present

## 2021-10-11 DIAGNOSIS — I509 Heart failure, unspecified: Secondary | ICD-10-CM | POA: Diagnosis not present

## 2021-10-11 DIAGNOSIS — Z9181 History of falling: Secondary | ICD-10-CM | POA: Diagnosis not present

## 2021-10-11 DIAGNOSIS — F329 Major depressive disorder, single episode, unspecified: Secondary | ICD-10-CM | POA: Diagnosis not present

## 2021-10-11 DIAGNOSIS — J449 Chronic obstructive pulmonary disease, unspecified: Secondary | ICD-10-CM | POA: Diagnosis not present

## 2021-10-11 DIAGNOSIS — Z993 Dependence on wheelchair: Secondary | ICD-10-CM | POA: Diagnosis not present

## 2021-10-11 DIAGNOSIS — R262 Difficulty in walking, not elsewhere classified: Secondary | ICD-10-CM | POA: Diagnosis not present

## 2021-10-11 DIAGNOSIS — F4312 Post-traumatic stress disorder, chronic: Secondary | ICD-10-CM | POA: Diagnosis not present

## 2021-10-13 DIAGNOSIS — S82851D Displaced trimalleolar fracture of right lower leg, subsequent encounter for closed fracture with routine healing: Secondary | ICD-10-CM | POA: Diagnosis not present

## 2021-10-15 DIAGNOSIS — I5022 Chronic systolic (congestive) heart failure: Secondary | ICD-10-CM | POA: Diagnosis not present

## 2021-10-15 DIAGNOSIS — E1165 Type 2 diabetes mellitus with hyperglycemia: Secondary | ICD-10-CM | POA: Diagnosis not present

## 2021-10-15 DIAGNOSIS — I110 Hypertensive heart disease with heart failure: Secondary | ICD-10-CM | POA: Diagnosis not present

## 2021-10-17 DIAGNOSIS — Z7952 Long term (current) use of systemic steroids: Secondary | ICD-10-CM | POA: Diagnosis not present

## 2021-10-17 DIAGNOSIS — Z87891 Personal history of nicotine dependence: Secondary | ICD-10-CM | POA: Diagnosis not present

## 2021-10-17 DIAGNOSIS — M6281 Muscle weakness (generalized): Secondary | ICD-10-CM | POA: Diagnosis not present

## 2021-10-17 DIAGNOSIS — I110 Hypertensive heart disease with heart failure: Secondary | ICD-10-CM | POA: Diagnosis not present

## 2021-10-17 DIAGNOSIS — F329 Major depressive disorder, single episode, unspecified: Secondary | ICD-10-CM | POA: Diagnosis not present

## 2021-10-17 DIAGNOSIS — F429 Obsessive-compulsive disorder, unspecified: Secondary | ICD-10-CM | POA: Diagnosis not present

## 2021-10-17 DIAGNOSIS — W010XXD Fall on same level from slipping, tripping and stumbling without subsequent striking against object, subsequent encounter: Secondary | ICD-10-CM | POA: Diagnosis not present

## 2021-10-17 DIAGNOSIS — F411 Generalized anxiety disorder: Secondary | ICD-10-CM | POA: Diagnosis not present

## 2021-10-17 DIAGNOSIS — Z7969 Long term (current) use of other immunomodulators and immunosuppressants: Secondary | ICD-10-CM | POA: Diagnosis not present

## 2021-10-17 DIAGNOSIS — F4312 Post-traumatic stress disorder, chronic: Secondary | ICD-10-CM | POA: Diagnosis not present

## 2021-10-17 DIAGNOSIS — E119 Type 2 diabetes mellitus without complications: Secondary | ICD-10-CM | POA: Diagnosis not present

## 2021-10-17 DIAGNOSIS — Z993 Dependence on wheelchair: Secondary | ICD-10-CM | POA: Diagnosis not present

## 2021-10-17 DIAGNOSIS — C8590 Non-Hodgkin lymphoma, unspecified, unspecified site: Secondary | ICD-10-CM | POA: Diagnosis not present

## 2021-10-17 DIAGNOSIS — E1140 Type 2 diabetes mellitus with diabetic neuropathy, unspecified: Secondary | ICD-10-CM | POA: Diagnosis not present

## 2021-10-17 DIAGNOSIS — S9304XA Dislocation of right ankle joint, initial encounter: Secondary | ICD-10-CM | POA: Diagnosis not present

## 2021-10-17 DIAGNOSIS — R262 Difficulty in walking, not elsewhere classified: Secondary | ICD-10-CM | POA: Diagnosis not present

## 2021-10-17 DIAGNOSIS — Z79891 Long term (current) use of opiate analgesic: Secondary | ICD-10-CM | POA: Diagnosis not present

## 2021-10-17 DIAGNOSIS — J449 Chronic obstructive pulmonary disease, unspecified: Secondary | ICD-10-CM | POA: Diagnosis not present

## 2021-10-17 DIAGNOSIS — I509 Heart failure, unspecified: Secondary | ICD-10-CM | POA: Diagnosis not present

## 2021-10-17 DIAGNOSIS — S82852D Displaced trimalleolar fracture of left lower leg, subsequent encounter for closed fracture with routine healing: Secondary | ICD-10-CM | POA: Diagnosis not present

## 2021-10-17 DIAGNOSIS — Z9181 History of falling: Secondary | ICD-10-CM | POA: Diagnosis not present

## 2021-10-17 DIAGNOSIS — M3501 Sicca syndrome with keratoconjunctivitis: Secondary | ICD-10-CM | POA: Diagnosis not present

## 2021-10-18 DIAGNOSIS — J449 Chronic obstructive pulmonary disease, unspecified: Secondary | ICD-10-CM | POA: Diagnosis not present

## 2021-10-18 DIAGNOSIS — F411 Generalized anxiety disorder: Secondary | ICD-10-CM | POA: Diagnosis not present

## 2021-10-18 DIAGNOSIS — Z87891 Personal history of nicotine dependence: Secondary | ICD-10-CM | POA: Diagnosis not present

## 2021-10-18 DIAGNOSIS — F429 Obsessive-compulsive disorder, unspecified: Secondary | ICD-10-CM | POA: Diagnosis not present

## 2021-10-18 DIAGNOSIS — Z7969 Long term (current) use of other immunomodulators and immunosuppressants: Secondary | ICD-10-CM | POA: Diagnosis not present

## 2021-10-18 DIAGNOSIS — R262 Difficulty in walking, not elsewhere classified: Secondary | ICD-10-CM | POA: Diagnosis not present

## 2021-10-18 DIAGNOSIS — I110 Hypertensive heart disease with heart failure: Secondary | ICD-10-CM | POA: Diagnosis not present

## 2021-10-18 DIAGNOSIS — S82852D Displaced trimalleolar fracture of left lower leg, subsequent encounter for closed fracture with routine healing: Secondary | ICD-10-CM | POA: Diagnosis not present

## 2021-10-18 DIAGNOSIS — C8590 Non-Hodgkin lymphoma, unspecified, unspecified site: Secondary | ICD-10-CM | POA: Diagnosis not present

## 2021-10-18 DIAGNOSIS — M3501 Sicca syndrome with keratoconjunctivitis: Secondary | ICD-10-CM | POA: Diagnosis not present

## 2021-10-18 DIAGNOSIS — Z79891 Long term (current) use of opiate analgesic: Secondary | ICD-10-CM | POA: Diagnosis not present

## 2021-10-18 DIAGNOSIS — E1140 Type 2 diabetes mellitus with diabetic neuropathy, unspecified: Secondary | ICD-10-CM | POA: Diagnosis not present

## 2021-10-18 DIAGNOSIS — Z993 Dependence on wheelchair: Secondary | ICD-10-CM | POA: Diagnosis not present

## 2021-10-18 DIAGNOSIS — W010XXD Fall on same level from slipping, tripping and stumbling without subsequent striking against object, subsequent encounter: Secondary | ICD-10-CM | POA: Diagnosis not present

## 2021-10-18 DIAGNOSIS — F329 Major depressive disorder, single episode, unspecified: Secondary | ICD-10-CM | POA: Diagnosis not present

## 2021-10-18 DIAGNOSIS — S9304XA Dislocation of right ankle joint, initial encounter: Secondary | ICD-10-CM | POA: Diagnosis not present

## 2021-10-18 DIAGNOSIS — M6281 Muscle weakness (generalized): Secondary | ICD-10-CM | POA: Diagnosis not present

## 2021-10-18 DIAGNOSIS — I509 Heart failure, unspecified: Secondary | ICD-10-CM | POA: Diagnosis not present

## 2021-10-18 DIAGNOSIS — Z7952 Long term (current) use of systemic steroids: Secondary | ICD-10-CM | POA: Diagnosis not present

## 2021-10-18 DIAGNOSIS — F4312 Post-traumatic stress disorder, chronic: Secondary | ICD-10-CM | POA: Diagnosis not present

## 2021-10-18 DIAGNOSIS — Z9181 History of falling: Secondary | ICD-10-CM | POA: Diagnosis not present

## 2021-10-18 DIAGNOSIS — E119 Type 2 diabetes mellitus without complications: Secondary | ICD-10-CM | POA: Diagnosis not present

## 2021-10-20 DIAGNOSIS — R262 Difficulty in walking, not elsewhere classified: Secondary | ICD-10-CM | POA: Diagnosis not present

## 2021-10-20 DIAGNOSIS — F411 Generalized anxiety disorder: Secondary | ICD-10-CM | POA: Diagnosis not present

## 2021-10-20 DIAGNOSIS — I509 Heart failure, unspecified: Secondary | ICD-10-CM | POA: Diagnosis not present

## 2021-10-20 DIAGNOSIS — Z993 Dependence on wheelchair: Secondary | ICD-10-CM | POA: Diagnosis not present

## 2021-10-20 DIAGNOSIS — M3501 Sicca syndrome with keratoconjunctivitis: Secondary | ICD-10-CM | POA: Diagnosis not present

## 2021-10-20 DIAGNOSIS — J449 Chronic obstructive pulmonary disease, unspecified: Secondary | ICD-10-CM | POA: Diagnosis not present

## 2021-10-20 DIAGNOSIS — F329 Major depressive disorder, single episode, unspecified: Secondary | ICD-10-CM | POA: Diagnosis not present

## 2021-10-20 DIAGNOSIS — S82852D Displaced trimalleolar fracture of left lower leg, subsequent encounter for closed fracture with routine healing: Secondary | ICD-10-CM | POA: Diagnosis not present

## 2021-10-20 DIAGNOSIS — Z7952 Long term (current) use of systemic steroids: Secondary | ICD-10-CM | POA: Diagnosis not present

## 2021-10-20 DIAGNOSIS — M6281 Muscle weakness (generalized): Secondary | ICD-10-CM | POA: Diagnosis not present

## 2021-10-20 DIAGNOSIS — W010XXD Fall on same level from slipping, tripping and stumbling without subsequent striking against object, subsequent encounter: Secondary | ICD-10-CM | POA: Diagnosis not present

## 2021-10-20 DIAGNOSIS — F429 Obsessive-compulsive disorder, unspecified: Secondary | ICD-10-CM | POA: Diagnosis not present

## 2021-10-20 DIAGNOSIS — F4312 Post-traumatic stress disorder, chronic: Secondary | ICD-10-CM | POA: Diagnosis not present

## 2021-10-20 DIAGNOSIS — I110 Hypertensive heart disease with heart failure: Secondary | ICD-10-CM | POA: Diagnosis not present

## 2021-10-20 DIAGNOSIS — Z79891 Long term (current) use of opiate analgesic: Secondary | ICD-10-CM | POA: Diagnosis not present

## 2021-10-20 DIAGNOSIS — E1140 Type 2 diabetes mellitus with diabetic neuropathy, unspecified: Secondary | ICD-10-CM | POA: Diagnosis not present

## 2021-10-20 DIAGNOSIS — Z87891 Personal history of nicotine dependence: Secondary | ICD-10-CM | POA: Diagnosis not present

## 2021-10-20 DIAGNOSIS — E119 Type 2 diabetes mellitus without complications: Secondary | ICD-10-CM | POA: Diagnosis not present

## 2021-10-20 DIAGNOSIS — Z7969 Long term (current) use of other immunomodulators and immunosuppressants: Secondary | ICD-10-CM | POA: Diagnosis not present

## 2021-10-20 DIAGNOSIS — Z9181 History of falling: Secondary | ICD-10-CM | POA: Diagnosis not present

## 2021-10-20 DIAGNOSIS — S9304XA Dislocation of right ankle joint, initial encounter: Secondary | ICD-10-CM | POA: Diagnosis not present

## 2021-10-20 DIAGNOSIS — C8590 Non-Hodgkin lymphoma, unspecified, unspecified site: Secondary | ICD-10-CM | POA: Diagnosis not present

## 2021-10-23 DIAGNOSIS — S82851D Displaced trimalleolar fracture of right lower leg, subsequent encounter for closed fracture with routine healing: Secondary | ICD-10-CM | POA: Diagnosis not present

## 2021-10-24 DIAGNOSIS — S82851D Displaced trimalleolar fracture of right lower leg, subsequent encounter for closed fracture with routine healing: Secondary | ICD-10-CM | POA: Diagnosis not present

## 2021-10-26 DIAGNOSIS — Z9889 Other specified postprocedural states: Secondary | ICD-10-CM | POA: Diagnosis not present

## 2021-10-26 DIAGNOSIS — Z09 Encounter for follow-up examination after completed treatment for conditions other than malignant neoplasm: Secondary | ICD-10-CM | POA: Diagnosis not present

## 2021-10-26 DIAGNOSIS — S82891A Other fracture of right lower leg, initial encounter for closed fracture: Secondary | ICD-10-CM | POA: Diagnosis not present

## 2021-11-14 DIAGNOSIS — N182 Chronic kidney disease, stage 2 (mild): Secondary | ICD-10-CM | POA: Diagnosis not present

## 2021-11-14 DIAGNOSIS — D649 Anemia, unspecified: Secondary | ICD-10-CM | POA: Diagnosis not present

## 2021-11-14 DIAGNOSIS — Z87891 Personal history of nicotine dependence: Secondary | ICD-10-CM | POA: Diagnosis not present

## 2021-11-14 DIAGNOSIS — E1122 Type 2 diabetes mellitus with diabetic chronic kidney disease: Secondary | ICD-10-CM | POA: Diagnosis not present

## 2021-11-14 DIAGNOSIS — E7800 Pure hypercholesterolemia, unspecified: Secondary | ICD-10-CM | POA: Diagnosis not present

## 2021-11-14 DIAGNOSIS — Z885 Allergy status to narcotic agent status: Secondary | ICD-10-CM | POA: Diagnosis not present

## 2021-11-14 DIAGNOSIS — M3500 Sicca syndrome, unspecified: Secondary | ICD-10-CM | POA: Diagnosis not present

## 2021-11-14 DIAGNOSIS — E1140 Type 2 diabetes mellitus with diabetic neuropathy, unspecified: Secondary | ICD-10-CM | POA: Diagnosis not present

## 2021-11-14 DIAGNOSIS — I509 Heart failure, unspecified: Secondary | ICD-10-CM | POA: Diagnosis not present

## 2021-11-14 DIAGNOSIS — L02416 Cutaneous abscess of left lower limb: Secondary | ICD-10-CM | POA: Diagnosis not present

## 2021-11-14 DIAGNOSIS — J449 Chronic obstructive pulmonary disease, unspecified: Secondary | ICD-10-CM | POA: Diagnosis not present

## 2021-11-14 DIAGNOSIS — T8131XA Disruption of external operation (surgical) wound, not elsewhere classified, initial encounter: Secondary | ICD-10-CM | POA: Diagnosis not present

## 2021-11-14 DIAGNOSIS — T8132XA Disruption of internal operation (surgical) wound, not elsewhere classified, initial encounter: Secondary | ICD-10-CM | POA: Diagnosis not present

## 2021-11-21 DIAGNOSIS — S82851D Displaced trimalleolar fracture of right lower leg, subsequent encounter for closed fracture with routine healing: Secondary | ICD-10-CM | POA: Diagnosis not present

## 2021-12-04 DIAGNOSIS — S82851D Displaced trimalleolar fracture of right lower leg, subsequent encounter for closed fracture with routine healing: Secondary | ICD-10-CM | POA: Diagnosis not present

## 2021-12-04 DIAGNOSIS — M7989 Other specified soft tissue disorders: Secondary | ICD-10-CM | POA: Diagnosis not present

## 2021-12-08 DIAGNOSIS — K219 Gastro-esophageal reflux disease without esophagitis: Secondary | ICD-10-CM | POA: Diagnosis not present

## 2021-12-08 DIAGNOSIS — I10 Essential (primary) hypertension: Secondary | ICD-10-CM | POA: Diagnosis not present

## 2021-12-08 DIAGNOSIS — E7849 Other hyperlipidemia: Secondary | ICD-10-CM | POA: Diagnosis not present

## 2021-12-08 DIAGNOSIS — D649 Anemia, unspecified: Secondary | ICD-10-CM | POA: Diagnosis not present

## 2021-12-08 DIAGNOSIS — E782 Mixed hyperlipidemia: Secondary | ICD-10-CM | POA: Diagnosis not present

## 2021-12-08 DIAGNOSIS — E1165 Type 2 diabetes mellitus with hyperglycemia: Secondary | ICD-10-CM | POA: Diagnosis not present

## 2021-12-11 DIAGNOSIS — Z0001 Encounter for general adult medical examination with abnormal findings: Secondary | ICD-10-CM | POA: Diagnosis not present

## 2021-12-11 DIAGNOSIS — E119 Type 2 diabetes mellitus without complications: Secondary | ICD-10-CM | POA: Diagnosis not present

## 2021-12-11 DIAGNOSIS — S82891A Other fracture of right lower leg, initial encounter for closed fracture: Secondary | ICD-10-CM | POA: Diagnosis not present

## 2021-12-11 DIAGNOSIS — Z9889 Other specified postprocedural states: Secondary | ICD-10-CM | POA: Diagnosis not present

## 2021-12-11 DIAGNOSIS — F419 Anxiety disorder, unspecified: Secondary | ICD-10-CM | POA: Diagnosis not present

## 2021-12-11 DIAGNOSIS — I509 Heart failure, unspecified: Secondary | ICD-10-CM | POA: Diagnosis not present

## 2021-12-11 DIAGNOSIS — F5105 Insomnia due to other mental disorder: Secondary | ICD-10-CM | POA: Diagnosis not present

## 2021-12-11 DIAGNOSIS — F339 Major depressive disorder, recurrent, unspecified: Secondary | ICD-10-CM | POA: Diagnosis not present

## 2021-12-12 DIAGNOSIS — E1122 Type 2 diabetes mellitus with diabetic chronic kidney disease: Secondary | ICD-10-CM | POA: Diagnosis not present

## 2021-12-12 DIAGNOSIS — J449 Chronic obstructive pulmonary disease, unspecified: Secondary | ICD-10-CM | POA: Diagnosis not present

## 2021-12-12 DIAGNOSIS — N182 Chronic kidney disease, stage 2 (mild): Secondary | ICD-10-CM | POA: Diagnosis not present

## 2021-12-12 DIAGNOSIS — I509 Heart failure, unspecified: Secondary | ICD-10-CM | POA: Diagnosis not present

## 2021-12-12 DIAGNOSIS — F3289 Other specified depressive episodes: Secondary | ICD-10-CM | POA: Diagnosis not present

## 2021-12-12 DIAGNOSIS — S82892D Other fracture of left lower leg, subsequent encounter for closed fracture with routine healing: Secondary | ICD-10-CM | POA: Diagnosis not present

## 2021-12-12 DIAGNOSIS — Z87891 Personal history of nicotine dependence: Secondary | ICD-10-CM | POA: Diagnosis not present

## 2021-12-18 DIAGNOSIS — J449 Chronic obstructive pulmonary disease, unspecified: Secondary | ICD-10-CM | POA: Diagnosis not present

## 2021-12-18 DIAGNOSIS — E1122 Type 2 diabetes mellitus with diabetic chronic kidney disease: Secondary | ICD-10-CM | POA: Diagnosis not present

## 2021-12-18 DIAGNOSIS — I509 Heart failure, unspecified: Secondary | ICD-10-CM | POA: Diagnosis not present

## 2021-12-18 DIAGNOSIS — N182 Chronic kidney disease, stage 2 (mild): Secondary | ICD-10-CM | POA: Diagnosis not present

## 2021-12-18 DIAGNOSIS — S82892D Other fracture of left lower leg, subsequent encounter for closed fracture with routine healing: Secondary | ICD-10-CM | POA: Diagnosis not present

## 2021-12-18 DIAGNOSIS — F3289 Other specified depressive episodes: Secondary | ICD-10-CM | POA: Diagnosis not present

## 2021-12-18 DIAGNOSIS — Z87891 Personal history of nicotine dependence: Secondary | ICD-10-CM | POA: Diagnosis not present

## 2021-12-22 DIAGNOSIS — S82851D Displaced trimalleolar fracture of right lower leg, subsequent encounter for closed fracture with routine healing: Secondary | ICD-10-CM | POA: Diagnosis not present

## 2021-12-26 ENCOUNTER — Ambulatory Visit (INDEPENDENT_AMBULATORY_CARE_PROVIDER_SITE_OTHER): Payer: HMO | Admitting: Gastroenterology

## 2021-12-26 ENCOUNTER — Encounter: Payer: Self-pay | Admitting: Gastroenterology

## 2021-12-26 VITALS — BP 104/60 | HR 88 | Temp 97.1°F | Ht 64.5 in | Wt 140.0 lb

## 2021-12-26 DIAGNOSIS — K5900 Constipation, unspecified: Secondary | ICD-10-CM | POA: Diagnosis not present

## 2021-12-26 DIAGNOSIS — R1013 Epigastric pain: Secondary | ICD-10-CM

## 2021-12-26 MED ORDER — PANTOPRAZOLE SODIUM 40 MG PO TBEC: 40.0000 mg | DELAYED_RELEASE_TABLET | Freq: Every day | ORAL | 3 refills | Status: DC
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
## 2021-12-26 NOTE — Patient Instructions (Signed)
I am glad you are doing well! ? ?I refilled pantoprazole to take once daily. ? ?We will see you in 1 year or sooner if needed! ? ?I enjoyed seeing you again today! As you know, I value our relationship and want to provide genuine, compassionate, and quality care. I welcome your feedback. If you receive a survey regarding your visit,  I greatly appreciate you taking time to fill this out. See you next time! ? ?Annitta Needs, PhD, ANP-BC ?[facility]  ? ?

## 2022-01-01 DIAGNOSIS — C8590 Non-Hodgkin lymphoma, unspecified, unspecified site: Secondary | ICD-10-CM | POA: Diagnosis not present

## 2022-01-01 DIAGNOSIS — Z1231 Encounter for screening mammogram for malignant neoplasm of breast: Secondary | ICD-10-CM | POA: Diagnosis not present

## 2022-01-01 DIAGNOSIS — Z87891 Personal history of nicotine dependence: Secondary | ICD-10-CM | POA: Diagnosis not present

## 2022-01-01 DIAGNOSIS — R911 Solitary pulmonary nodule: Secondary | ICD-10-CM | POA: Diagnosis not present

## 2022-01-01 DIAGNOSIS — S82851D Displaced trimalleolar fracture of right lower leg, subsequent encounter for closed fracture with routine healing: Secondary | ICD-10-CM | POA: Diagnosis not present

## 2022-01-02 DIAGNOSIS — Z1231 Encounter for screening mammogram for malignant neoplasm of breast: Secondary | ICD-10-CM | POA: Diagnosis not present

## 2022-01-02 DIAGNOSIS — Z87891 Personal history of nicotine dependence: Secondary | ICD-10-CM | POA: Diagnosis not present

## 2022-01-02 DIAGNOSIS — R911 Solitary pulmonary nodule: Secondary | ICD-10-CM | POA: Diagnosis not present

## 2022-01-03 DIAGNOSIS — Z789 Other specified health status: Secondary | ICD-10-CM | POA: Diagnosis not present

## 2022-01-03 DIAGNOSIS — R2681 Unsteadiness on feet: Secondary | ICD-10-CM | POA: Diagnosis not present

## 2022-01-03 DIAGNOSIS — R29898 Other symptoms and signs involving the musculoskeletal system: Secondary | ICD-10-CM | POA: Diagnosis not present

## 2022-01-03 DIAGNOSIS — Z7409 Other reduced mobility: Secondary | ICD-10-CM | POA: Diagnosis not present

## 2022-01-03 DIAGNOSIS — S82851D Displaced trimalleolar fracture of right lower leg, subsequent encounter for closed fracture with routine healing: Secondary | ICD-10-CM | POA: Diagnosis not present

## 2022-01-03 DIAGNOSIS — M25671 Stiffness of right ankle, not elsewhere classified: Secondary | ICD-10-CM | POA: Diagnosis not present

## 2022-01-08 DIAGNOSIS — Z87891 Personal history of nicotine dependence: Secondary | ICD-10-CM | POA: Diagnosis not present

## 2022-01-08 DIAGNOSIS — M79672 Pain in left foot: Secondary | ICD-10-CM | POA: Diagnosis not present

## 2022-01-08 DIAGNOSIS — Z885 Allergy status to narcotic agent status: Secondary | ICD-10-CM | POA: Diagnosis not present

## 2022-01-08 DIAGNOSIS — S93602A Unspecified sprain of left foot, initial encounter: Secondary | ICD-10-CM | POA: Diagnosis not present

## 2022-01-08 DIAGNOSIS — W010XXA Fall on same level from slipping, tripping and stumbling without subsequent striking against object, initial encounter: Secondary | ICD-10-CM | POA: Diagnosis not present

## 2022-01-08 DIAGNOSIS — Z888 Allergy status to other drugs, medicaments and biological substances status: Secondary | ICD-10-CM | POA: Diagnosis not present

## 2022-01-08 DIAGNOSIS — N182 Chronic kidney disease, stage 2 (mild): Secondary | ICD-10-CM | POA: Diagnosis not present

## 2022-01-08 DIAGNOSIS — F419 Anxiety disorder, unspecified: Secondary | ICD-10-CM | POA: Diagnosis not present

## 2022-01-08 DIAGNOSIS — E7800 Pure hypercholesterolemia, unspecified: Secondary | ICD-10-CM | POA: Diagnosis not present

## 2022-01-08 DIAGNOSIS — E1122 Type 2 diabetes mellitus with diabetic chronic kidney disease: Secondary | ICD-10-CM | POA: Diagnosis not present

## 2022-01-08 DIAGNOSIS — Z79899 Other long term (current) drug therapy: Secondary | ICD-10-CM | POA: Diagnosis not present

## 2022-01-11 ENCOUNTER — Encounter: Payer: Self-pay | Admitting: Gastroenterology

## 2022-01-16 DIAGNOSIS — S96912A Strain of unspecified muscle and tendon at ankle and foot level, left foot, initial encounter: Secondary | ICD-10-CM | POA: Diagnosis not present

## 2022-01-16 DIAGNOSIS — S82851D Displaced trimalleolar fracture of right lower leg, subsequent encounter for closed fracture with routine healing: Secondary | ICD-10-CM | POA: Diagnosis not present

## 2022-01-29 ENCOUNTER — Encounter (INDEPENDENT_AMBULATORY_CARE_PROVIDER_SITE_OTHER): Payer: Self-pay

## 2022-01-29 DIAGNOSIS — H43812 Vitreous degeneration, left eye: Secondary | ICD-10-CM | POA: Diagnosis not present

## 2022-01-29 DIAGNOSIS — Z79899 Other long term (current) drug therapy: Secondary | ICD-10-CM | POA: Diagnosis not present

## 2022-01-29 DIAGNOSIS — H25013 Cortical age-related cataract, bilateral: Secondary | ICD-10-CM | POA: Diagnosis not present

## 2022-01-29 DIAGNOSIS — S82892A Other fracture of left lower leg, initial encounter for closed fracture: Secondary | ICD-10-CM | POA: Diagnosis not present

## 2022-01-29 DIAGNOSIS — S96912A Strain of unspecified muscle and tendon at ankle and foot level, left foot, initial encounter: Secondary | ICD-10-CM | POA: Diagnosis not present

## 2022-01-29 DIAGNOSIS — H25813 Combined forms of age-related cataract, bilateral: Secondary | ICD-10-CM | POA: Diagnosis not present

## 2022-01-29 DIAGNOSIS — H2513 Age-related nuclear cataract, bilateral: Secondary | ICD-10-CM | POA: Diagnosis not present

## 2022-01-29 DIAGNOSIS — R52 Pain, unspecified: Secondary | ICD-10-CM | POA: Diagnosis not present

## 2022-01-29 DIAGNOSIS — H04123 Dry eye syndrome of bilateral lacrimal glands: Secondary | ICD-10-CM | POA: Diagnosis not present

## 2022-01-29 DIAGNOSIS — H16223 Keratoconjunctivitis sicca, not specified as Sjogren's, bilateral: Secondary | ICD-10-CM | POA: Diagnosis not present

## 2022-01-29 DIAGNOSIS — H18591 Other hereditary corneal dystrophies, right eye: Secondary | ICD-10-CM | POA: Diagnosis not present

## 2022-01-29 DIAGNOSIS — Z09 Encounter for follow-up examination after completed treatment for conditions other than malignant neoplasm: Secondary | ICD-10-CM | POA: Diagnosis not present

## 2022-01-30 DIAGNOSIS — F419 Anxiety disorder, unspecified: Secondary | ICD-10-CM | POA: Diagnosis not present

## 2022-01-30 DIAGNOSIS — Z9889 Other specified postprocedural states: Secondary | ICD-10-CM | POA: Diagnosis not present

## 2022-01-30 DIAGNOSIS — I509 Heart failure, unspecified: Secondary | ICD-10-CM | POA: Diagnosis not present

## 2022-01-30 DIAGNOSIS — S82891A Other fracture of right lower leg, initial encounter for closed fracture: Secondary | ICD-10-CM | POA: Diagnosis not present

## 2022-01-30 DIAGNOSIS — F339 Major depressive disorder, recurrent, unspecified: Secondary | ICD-10-CM | POA: Diagnosis not present

## 2022-01-30 DIAGNOSIS — M3500 Sicca syndrome, unspecified: Secondary | ICD-10-CM | POA: Diagnosis not present

## 2022-01-30 DIAGNOSIS — F5105 Insomnia due to other mental disorder: Secondary | ICD-10-CM | POA: Diagnosis not present

## 2022-01-30 DIAGNOSIS — E119 Type 2 diabetes mellitus without complications: Secondary | ICD-10-CM | POA: Diagnosis not present

## 2022-02-02 DIAGNOSIS — S82851D Displaced trimalleolar fracture of right lower leg, subsequent encounter for closed fracture with routine healing: Secondary | ICD-10-CM | POA: Diagnosis not present

## 2022-02-02 DIAGNOSIS — M25671 Stiffness of right ankle, not elsewhere classified: Secondary | ICD-10-CM | POA: Diagnosis not present

## 2022-02-02 DIAGNOSIS — R29898 Other symptoms and signs involving the musculoskeletal system: Secondary | ICD-10-CM | POA: Diagnosis not present

## 2022-02-02 DIAGNOSIS — Z789 Other specified health status: Secondary | ICD-10-CM | POA: Diagnosis not present

## 2022-02-02 DIAGNOSIS — Z7409 Other reduced mobility: Secondary | ICD-10-CM | POA: Diagnosis not present

## 2022-02-02 DIAGNOSIS — R2681 Unsteadiness on feet: Secondary | ICD-10-CM | POA: Diagnosis not present

## 2022-02-08 ENCOUNTER — Encounter (INDEPENDENT_AMBULATORY_CARE_PROVIDER_SITE_OTHER): Payer: Self-pay | Admitting: Ophthalmology

## 2022-02-08 ENCOUNTER — Ambulatory Visit (INDEPENDENT_AMBULATORY_CARE_PROVIDER_SITE_OTHER): Payer: HMO | Admitting: Ophthalmology

## 2022-02-08 DIAGNOSIS — H2513 Age-related nuclear cataract, bilateral: Secondary | ICD-10-CM | POA: Insufficient documentation

## 2022-02-08 DIAGNOSIS — H43812 Vitreous degeneration, left eye: Secondary | ICD-10-CM | POA: Diagnosis not present

## 2022-02-08 DIAGNOSIS — E119 Type 2 diabetes mellitus without complications: Secondary | ICD-10-CM

## 2022-02-08 DIAGNOSIS — Z79899 Other long term (current) drug therapy: Secondary | ICD-10-CM

## 2022-02-08 DIAGNOSIS — H43821 Vitreomacular adhesion, right eye: Secondary | ICD-10-CM | POA: Diagnosis not present

## 2022-02-08 NOTE — Assessment & Plan Note (Signed)
No detectable diabetic retinopathy today 

## 2022-02-08 NOTE — Assessment & Plan Note (Signed)
Appropriate and age-related.  Could account for symptomatology.  Patient needs general eye examination and evaluation refraction and possible cataract issues
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
## 2022-02-08 NOTE — Assessment & Plan Note (Signed)
Physiologic OS 

## 2022-02-14 DIAGNOSIS — J441 Chronic obstructive pulmonary disease with (acute) exacerbation: Secondary | ICD-10-CM | POA: Diagnosis not present

## 2022-02-14 DIAGNOSIS — I110 Hypertensive heart disease with heart failure: Secondary | ICD-10-CM | POA: Diagnosis not present

## 2022-02-14 DIAGNOSIS — E1165 Type 2 diabetes mellitus with hyperglycemia: Secondary | ICD-10-CM | POA: Diagnosis not present

## 2022-02-14 DIAGNOSIS — I5022 Chronic systolic (congestive) heart failure: Secondary | ICD-10-CM | POA: Diagnosis not present

## 2022-02-19 DIAGNOSIS — C8590 Non-Hodgkin lymphoma, unspecified, unspecified site: Secondary | ICD-10-CM | POA: Diagnosis not present

## 2022-02-19 DIAGNOSIS — F330 Major depressive disorder, recurrent, mild: Secondary | ICD-10-CM | POA: Diagnosis not present

## 2022-02-19 DIAGNOSIS — I130 Hypertensive heart and chronic kidney disease with heart failure and stage 1 through stage 4 chronic kidney disease, or unspecified chronic kidney disease: Secondary | ICD-10-CM | POA: Diagnosis not present

## 2022-02-19 DIAGNOSIS — M3500 Sicca syndrome, unspecified: Secondary | ICD-10-CM | POA: Diagnosis not present

## 2022-02-19 DIAGNOSIS — E1169 Type 2 diabetes mellitus with other specified complication: Secondary | ICD-10-CM | POA: Diagnosis not present

## 2022-02-19 DIAGNOSIS — K219 Gastro-esophageal reflux disease without esophagitis: Secondary | ICD-10-CM | POA: Diagnosis not present

## 2022-02-19 DIAGNOSIS — J449 Chronic obstructive pulmonary disease, unspecified: Secondary | ICD-10-CM | POA: Diagnosis not present

## 2022-02-19 DIAGNOSIS — N183 Chronic kidney disease, stage 3 unspecified: Secondary | ICD-10-CM | POA: Diagnosis not present

## 2022-02-19 DIAGNOSIS — E785 Hyperlipidemia, unspecified: Secondary | ICD-10-CM | POA: Diagnosis not present

## 2022-02-19 DIAGNOSIS — E1122 Type 2 diabetes mellitus with diabetic chronic kidney disease: Secondary | ICD-10-CM | POA: Diagnosis not present

## 2022-02-19 DIAGNOSIS — I5042 Chronic combined systolic (congestive) and diastolic (congestive) heart failure: Secondary | ICD-10-CM | POA: Diagnosis not present

## 2022-02-19 DIAGNOSIS — D692 Other nonthrombocytopenic purpura: Secondary | ICD-10-CM | POA: Diagnosis not present

## 2022-02-22 ENCOUNTER — Telehealth: Payer: Self-pay | Admitting: Cardiology

## 2022-02-22 MED ORDER — TORSEMIDE 20 MG PO TABS: 20.0000 mg | ORAL_TABLET | Freq: Every day | ORAL | 3 refills | Status: AC | PRN

## 2022-02-22 MED ORDER — POTASSIUM CHLORIDE CRYS ER 20 MEQ PO TBCR: 20.0000 meq | EXTENDED_RELEASE_TABLET | Freq: Every day | ORAL | 3 refills | Status: AC | PRN

## 2022-02-22 NOTE — Telephone Encounter (Signed)
*  STAT* If patient is at the pharmacy, call can be transferred to refill team.   1. Which medications need to be refilled? (please list name of each medication and dose if known)   potassium chloride SA (K-DUR,KLOR-CON) 20 MEQ tablet    torsemide (DEMADEX) 20 MG tablet    2. Which pharmacy/location (including street and city if local pharmacy) is medication to be sent to?  [place], [place] - [address]. Do they need a 30 day or 90 day supply?  30 day

## 2022-03-08 DIAGNOSIS — I700 Atherosclerosis of aorta: Secondary | ICD-10-CM | POA: Diagnosis not present

## 2022-03-08 DIAGNOSIS — J439 Emphysema, unspecified: Secondary | ICD-10-CM | POA: Diagnosis not present

## 2022-03-08 DIAGNOSIS — R911 Solitary pulmonary nodule: Secondary | ICD-10-CM | POA: Diagnosis not present

## 2022-03-08 DIAGNOSIS — C8590 Non-Hodgkin lymphoma, unspecified, unspecified site: Secondary | ICD-10-CM | POA: Diagnosis not present

## 2022-03-08 DIAGNOSIS — Z87891 Personal history of nicotine dependence: Secondary | ICD-10-CM | POA: Diagnosis not present

## 2022-03-08 DIAGNOSIS — K449 Diaphragmatic hernia without obstruction or gangrene: Secondary | ICD-10-CM | POA: Diagnosis not present

## 2022-03-08 DIAGNOSIS — I2510 Atherosclerotic heart disease of native coronary artery without angina pectoris: Secondary | ICD-10-CM | POA: Diagnosis not present

## 2022-03-13 DIAGNOSIS — E1169 Type 2 diabetes mellitus with other specified complication: Secondary | ICD-10-CM | POA: Diagnosis not present

## 2022-03-13 DIAGNOSIS — I509 Heart failure, unspecified: Secondary | ICD-10-CM | POA: Diagnosis not present

## 2022-03-13 DIAGNOSIS — M8580 Other specified disorders of bone density and structure, unspecified site: Secondary | ICD-10-CM | POA: Diagnosis not present

## 2022-03-13 DIAGNOSIS — E7849 Other hyperlipidemia: Secondary | ICD-10-CM | POA: Diagnosis not present

## 2022-03-13 DIAGNOSIS — M3500 Sicca syndrome, unspecified: Secondary | ICD-10-CM | POA: Diagnosis not present

## 2022-03-13 DIAGNOSIS — I952 Hypotension due to drugs: Secondary | ICD-10-CM | POA: Diagnosis not present

## 2022-03-13 DIAGNOSIS — M25511 Pain in right shoulder: Secondary | ICD-10-CM | POA: Diagnosis not present

## 2022-03-13 DIAGNOSIS — K219 Gastro-esophageal reflux disease without esophagitis: Secondary | ICD-10-CM | POA: Diagnosis not present

## 2022-03-13 DIAGNOSIS — E782 Mixed hyperlipidemia: Secondary | ICD-10-CM | POA: Diagnosis not present

## 2022-03-13 DIAGNOSIS — Z6824 Body mass index (BMI) 24.0-24.9, adult: Secondary | ICD-10-CM | POA: Diagnosis not present

## 2022-03-15 DIAGNOSIS — R911 Solitary pulmonary nodule: Secondary | ICD-10-CM | POA: Diagnosis not present

## 2022-03-15 DIAGNOSIS — Z1231 Encounter for screening mammogram for malignant neoplasm of breast: Secondary | ICD-10-CM | POA: Diagnosis not present

## 2022-03-15 DIAGNOSIS — Z87891 Personal history of nicotine dependence: Secondary | ICD-10-CM | POA: Diagnosis not present

## 2022-03-15 DIAGNOSIS — Z8572 Personal history of non-Hodgkin lymphomas: Secondary | ICD-10-CM | POA: Diagnosis not present

## 2022-03-16 DIAGNOSIS — M3500 Sicca syndrome, unspecified: Secondary | ICD-10-CM | POA: Diagnosis not present

## 2022-03-16 DIAGNOSIS — D692 Other nonthrombocytopenic purpura: Secondary | ICD-10-CM | POA: Diagnosis not present

## 2022-03-16 DIAGNOSIS — Z9889 Other specified postprocedural states: Secondary | ICD-10-CM | POA: Diagnosis not present

## 2022-03-16 DIAGNOSIS — I700 Atherosclerosis of aorta: Secondary | ICD-10-CM | POA: Diagnosis not present

## 2022-03-16 DIAGNOSIS — K2100 Gastro-esophageal reflux disease with esophagitis, without bleeding: Secondary | ICD-10-CM | POA: Diagnosis not present

## 2022-03-16 DIAGNOSIS — Z0001 Encounter for general adult medical examination with abnormal findings: Secondary | ICD-10-CM | POA: Diagnosis not present

## 2022-03-16 DIAGNOSIS — J449 Chronic obstructive pulmonary disease, unspecified: Secondary | ICD-10-CM | POA: Diagnosis not present

## 2022-03-16 DIAGNOSIS — I5022 Chronic systolic (congestive) heart failure: Secondary | ICD-10-CM | POA: Diagnosis not present

## 2022-03-16 DIAGNOSIS — N1831 Chronic kidney disease, stage 3a: Secondary | ICD-10-CM | POA: Diagnosis not present

## 2022-03-16 DIAGNOSIS — J441 Chronic obstructive pulmonary disease with (acute) exacerbation: Secondary | ICD-10-CM | POA: Diagnosis not present

## 2022-03-16 DIAGNOSIS — E1122 Type 2 diabetes mellitus with diabetic chronic kidney disease: Secondary | ICD-10-CM | POA: Diagnosis not present

## 2022-03-16 DIAGNOSIS — I5020 Unspecified systolic (congestive) heart failure: Secondary | ICD-10-CM | POA: Diagnosis not present

## 2022-03-16 DIAGNOSIS — R4582 Worries: Secondary | ICD-10-CM | POA: Diagnosis not present

## 2022-03-16 DIAGNOSIS — E1165 Type 2 diabetes mellitus with hyperglycemia: Secondary | ICD-10-CM | POA: Diagnosis not present

## 2022-03-21 DIAGNOSIS — M25572 Pain in left ankle and joints of left foot: Secondary | ICD-10-CM | POA: Diagnosis not present

## 2022-03-21 DIAGNOSIS — S82851D Displaced trimalleolar fracture of right lower leg, subsequent encounter for closed fracture with routine healing: Secondary | ICD-10-CM | POA: Diagnosis not present

## 2022-03-26 ENCOUNTER — Ambulatory Visit: Payer: HMO | Admitting: Cardiology

## 2022-03-26 ENCOUNTER — Encounter: Payer: Self-pay | Admitting: Cardiology

## 2022-03-26 VITALS — BP 118/64 | HR 88 | Ht 64.0 in | Wt 142.0 lb

## 2022-03-26 DIAGNOSIS — I5022 Chronic systolic (congestive) heart failure: Secondary | ICD-10-CM | POA: Diagnosis not present

## 2022-03-26 NOTE — Patient Instructions (Signed)
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
## 2022-03-29 DIAGNOSIS — M810 Age-related osteoporosis without current pathological fracture: Secondary | ICD-10-CM | POA: Diagnosis not present

## 2022-03-29 DIAGNOSIS — Z13820 Encounter for screening for osteoporosis: Secondary | ICD-10-CM | POA: Diagnosis not present

## 2022-03-29 DIAGNOSIS — M8589 Other specified disorders of bone density and structure, multiple sites: Secondary | ICD-10-CM | POA: Diagnosis not present

## 2022-04-02 ENCOUNTER — Ambulatory Visit (INDEPENDENT_AMBULATORY_CARE_PROVIDER_SITE_OTHER): Payer: PPO

## 2022-04-02 DIAGNOSIS — I5022 Chronic systolic (congestive) heart failure: Secondary | ICD-10-CM

## 2022-04-02 LAB — ECHOCARDIOGRAM COMPLETE
Area-P 1/2: 8.43 cm2
Calc EF: 40 %
MV M vel: 1.92 m/s
MV Peak grad: 14.7 mmHg
S' Lateral: 4.78 cm
Single Plane A2C EF: 38.2 %
Single Plane A4C EF: 38.9 %

## 2022-04-16 DIAGNOSIS — M8448XA Pathological fracture, other site, initial encounter for fracture: Secondary | ICD-10-CM | POA: Diagnosis not present

## 2022-04-16 DIAGNOSIS — E7800 Pure hypercholesterolemia, unspecified: Secondary | ICD-10-CM | POA: Diagnosis not present

## 2022-04-16 DIAGNOSIS — E1122 Type 2 diabetes mellitus with diabetic chronic kidney disease: Secondary | ICD-10-CM | POA: Diagnosis not present

## 2022-04-16 DIAGNOSIS — D631 Anemia in chronic kidney disease: Secondary | ICD-10-CM | POA: Diagnosis not present

## 2022-04-16 DIAGNOSIS — W1839XA Other fall on same level, initial encounter: Secondary | ICD-10-CM | POA: Diagnosis not present

## 2022-04-16 DIAGNOSIS — Z043 Encounter for examination and observation following other accident: Secondary | ICD-10-CM | POA: Diagnosis not present

## 2022-04-16 DIAGNOSIS — S2232XA Fracture of one rib, left side, initial encounter for closed fracture: Secondary | ICD-10-CM | POA: Diagnosis not present

## 2022-04-16 DIAGNOSIS — Z87891 Personal history of nicotine dependence: Secondary | ICD-10-CM | POA: Diagnosis not present

## 2022-04-16 DIAGNOSIS — N182 Chronic kidney disease, stage 2 (mild): Secondary | ICD-10-CM | POA: Diagnosis not present

## 2022-04-16 DIAGNOSIS — M797 Fibromyalgia: Secondary | ICD-10-CM | POA: Diagnosis not present

## 2022-04-16 DIAGNOSIS — S0181XA Laceration without foreign body of other part of head, initial encounter: Secondary | ICD-10-CM | POA: Diagnosis not present

## 2022-04-16 DIAGNOSIS — I509 Heart failure, unspecified: Secondary | ICD-10-CM | POA: Diagnosis not present

## 2022-04-16 DIAGNOSIS — R0781 Pleurodynia: Secondary | ICD-10-CM | POA: Diagnosis not present

## 2022-04-16 DIAGNOSIS — Z79899 Other long term (current) drug therapy: Secondary | ICD-10-CM | POA: Diagnosis not present

## 2022-04-16 DIAGNOSIS — F4312 Post-traumatic stress disorder, chronic: Secondary | ICD-10-CM | POA: Diagnosis not present

## 2022-04-16 DIAGNOSIS — Z7982 Long term (current) use of aspirin: Secondary | ICD-10-CM | POA: Diagnosis not present

## 2022-04-16 DIAGNOSIS — J449 Chronic obstructive pulmonary disease, unspecified: Secondary | ICD-10-CM | POA: Diagnosis not present

## 2022-04-22 DIAGNOSIS — E440 Moderate protein-calorie malnutrition: Secondary | ICD-10-CM | POA: Diagnosis not present

## 2022-04-22 DIAGNOSIS — M797 Fibromyalgia: Secondary | ICD-10-CM | POA: Diagnosis not present

## 2022-04-22 DIAGNOSIS — R6521 Severe sepsis with septic shock: Secondary | ICD-10-CM | POA: Diagnosis not present

## 2022-04-22 DIAGNOSIS — N179 Acute kidney failure, unspecified: Secondary | ICD-10-CM | POA: Diagnosis not present

## 2022-04-22 DIAGNOSIS — N182 Chronic kidney disease, stage 2 (mild): Secondary | ICD-10-CM | POA: Diagnosis not present

## 2022-04-22 DIAGNOSIS — M3500 Sicca syndrome, unspecified: Secondary | ICD-10-CM | POA: Diagnosis not present

## 2022-04-22 DIAGNOSIS — J439 Emphysema, unspecified: Secondary | ICD-10-CM | POA: Diagnosis not present

## 2022-04-22 DIAGNOSIS — I959 Hypotension, unspecified: Secondary | ICD-10-CM | POA: Diagnosis not present

## 2022-04-22 DIAGNOSIS — A419 Sepsis, unspecified organism: Secondary | ICD-10-CM | POA: Diagnosis not present

## 2022-04-22 DIAGNOSIS — D530 Protein deficiency anemia: Secondary | ICD-10-CM | POA: Diagnosis not present

## 2022-04-22 DIAGNOSIS — E785 Hyperlipidemia, unspecified: Secondary | ICD-10-CM | POA: Diagnosis not present

## 2022-04-22 DIAGNOSIS — J189 Pneumonia, unspecified organism: Secondary | ICD-10-CM | POA: Diagnosis not present

## 2022-04-22 DIAGNOSIS — I2510 Atherosclerotic heart disease of native coronary artery without angina pectoris: Secondary | ICD-10-CM | POA: Diagnosis not present

## 2022-04-22 DIAGNOSIS — F331 Major depressive disorder, recurrent, moderate: Secondary | ICD-10-CM | POA: Diagnosis not present

## 2022-04-22 DIAGNOSIS — J9601 Acute respiratory failure with hypoxia: Secondary | ICD-10-CM | POA: Diagnosis not present

## 2022-04-22 DIAGNOSIS — R079 Chest pain, unspecified: Secondary | ICD-10-CM | POA: Diagnosis not present

## 2022-04-22 DIAGNOSIS — Z87891 Personal history of nicotine dependence: Secondary | ICD-10-CM | POA: Diagnosis not present

## 2022-04-22 DIAGNOSIS — R059 Cough, unspecified: Secondary | ICD-10-CM | POA: Diagnosis not present

## 2022-04-22 DIAGNOSIS — I4891 Unspecified atrial fibrillation: Secondary | ICD-10-CM | POA: Diagnosis not present

## 2022-04-22 DIAGNOSIS — G9341 Metabolic encephalopathy: Secondary | ICD-10-CM | POA: Diagnosis not present

## 2022-04-22 DIAGNOSIS — I480 Paroxysmal atrial fibrillation: Secondary | ICD-10-CM | POA: Diagnosis not present

## 2022-04-22 DIAGNOSIS — I5022 Chronic systolic (congestive) heart failure: Secondary | ICD-10-CM | POA: Diagnosis not present

## 2022-04-22 DIAGNOSIS — I130 Hypertensive heart and chronic kidney disease with heart failure and stage 1 through stage 4 chronic kidney disease, or unspecified chronic kidney disease: Secondary | ICD-10-CM | POA: Diagnosis not present

## 2022-04-22 DIAGNOSIS — I700 Atherosclerosis of aorta: Secondary | ICD-10-CM | POA: Diagnosis not present

## 2022-04-22 DIAGNOSIS — E1122 Type 2 diabetes mellitus with diabetic chronic kidney disease: Secondary | ICD-10-CM | POA: Diagnosis not present

## 2022-04-22 DIAGNOSIS — R0600 Dyspnea, unspecified: Secondary | ICD-10-CM | POA: Diagnosis not present

## 2022-04-22 DIAGNOSIS — S0990XA Unspecified injury of head, initial encounter: Secondary | ICD-10-CM | POA: Diagnosis not present

## 2022-04-22 DIAGNOSIS — J181 Lobar pneumonia, unspecified organism: Secondary | ICD-10-CM | POA: Diagnosis not present

## 2022-04-22 DIAGNOSIS — I340 Nonrheumatic mitral (valve) insufficiency: Secondary | ICD-10-CM | POA: Diagnosis not present

## 2022-04-22 DIAGNOSIS — S2232XA Fracture of one rib, left side, initial encounter for closed fracture: Secondary | ICD-10-CM | POA: Diagnosis not present

## 2022-04-22 DIAGNOSIS — Z20822 Contact with and (suspected) exposure to covid-19: Secondary | ICD-10-CM | POA: Diagnosis not present

## 2022-04-23 DIAGNOSIS — R079 Chest pain, unspecified: Secondary | ICD-10-CM | POA: Diagnosis not present

## 2022-04-23 DIAGNOSIS — J181 Lobar pneumonia, unspecified organism: Secondary | ICD-10-CM | POA: Diagnosis not present

## 2022-04-23 DIAGNOSIS — A419 Sepsis, unspecified organism: Secondary | ICD-10-CM | POA: Diagnosis not present

## 2022-04-23 DIAGNOSIS — N179 Acute kidney failure, unspecified: Secondary | ICD-10-CM | POA: Diagnosis not present

## 2022-04-24 DIAGNOSIS — R079 Chest pain, unspecified: Secondary | ICD-10-CM | POA: Diagnosis not present

## 2022-04-24 DIAGNOSIS — I4821 Permanent atrial fibrillation: Secondary | ICD-10-CM | POA: Insufficient documentation

## 2022-04-24 DIAGNOSIS — I5022 Chronic systolic (congestive) heart failure: Secondary | ICD-10-CM | POA: Diagnosis not present

## 2022-04-24 DIAGNOSIS — N179 Acute kidney failure, unspecified: Secondary | ICD-10-CM | POA: Diagnosis not present

## 2022-04-24 DIAGNOSIS — J181 Lobar pneumonia, unspecified organism: Secondary | ICD-10-CM | POA: Diagnosis not present

## 2022-04-24 DIAGNOSIS — I4891 Unspecified atrial fibrillation: Secondary | ICD-10-CM | POA: Diagnosis not present

## 2022-04-24 DIAGNOSIS — R0600 Dyspnea, unspecified: Secondary | ICD-10-CM | POA: Diagnosis not present

## 2022-04-24 DIAGNOSIS — I2510 Atherosclerotic heart disease of native coronary artery without angina pectoris: Secondary | ICD-10-CM | POA: Diagnosis not present

## 2022-04-24 DIAGNOSIS — A419 Sepsis, unspecified organism: Secondary | ICD-10-CM | POA: Diagnosis not present

## 2022-04-25 DIAGNOSIS — I2510 Atherosclerotic heart disease of native coronary artery without angina pectoris: Secondary | ICD-10-CM | POA: Diagnosis not present

## 2022-04-25 DIAGNOSIS — N179 Acute kidney failure, unspecified: Secondary | ICD-10-CM | POA: Diagnosis not present

## 2022-04-25 DIAGNOSIS — J181 Lobar pneumonia, unspecified organism: Secondary | ICD-10-CM | POA: Diagnosis not present

## 2022-04-25 DIAGNOSIS — I4891 Unspecified atrial fibrillation: Secondary | ICD-10-CM | POA: Diagnosis not present

## 2022-04-25 DIAGNOSIS — I5022 Chronic systolic (congestive) heart failure: Secondary | ICD-10-CM | POA: Diagnosis not present

## 2022-04-25 DIAGNOSIS — R079 Chest pain, unspecified: Secondary | ICD-10-CM | POA: Diagnosis not present

## 2022-04-25 DIAGNOSIS — A419 Sepsis, unspecified organism: Secondary | ICD-10-CM | POA: Diagnosis not present

## 2022-04-26 DIAGNOSIS — J9601 Acute respiratory failure with hypoxia: Secondary | ICD-10-CM | POA: Diagnosis not present

## 2022-04-26 DIAGNOSIS — I4891 Unspecified atrial fibrillation: Secondary | ICD-10-CM | POA: Diagnosis not present

## 2022-04-26 DIAGNOSIS — I2510 Atherosclerotic heart disease of native coronary artery without angina pectoris: Secondary | ICD-10-CM | POA: Diagnosis not present

## 2022-04-26 DIAGNOSIS — I5022 Chronic systolic (congestive) heart failure: Secondary | ICD-10-CM | POA: Diagnosis not present

## 2022-04-26 DIAGNOSIS — A419 Sepsis, unspecified organism: Secondary | ICD-10-CM | POA: Diagnosis not present

## 2022-04-26 DIAGNOSIS — J181 Lobar pneumonia, unspecified organism: Secondary | ICD-10-CM | POA: Diagnosis not present

## 2022-04-26 DIAGNOSIS — R079 Chest pain, unspecified: Secondary | ICD-10-CM | POA: Diagnosis not present

## 2022-04-26 DIAGNOSIS — N179 Acute kidney failure, unspecified: Secondary | ICD-10-CM | POA: Diagnosis not present

## 2022-04-27 DIAGNOSIS — R079 Chest pain, unspecified: Secondary | ICD-10-CM | POA: Diagnosis not present

## 2022-04-27 DIAGNOSIS — I4891 Unspecified atrial fibrillation: Secondary | ICD-10-CM | POA: Diagnosis not present

## 2022-04-27 DIAGNOSIS — N179 Acute kidney failure, unspecified: Secondary | ICD-10-CM | POA: Diagnosis not present

## 2022-04-27 DIAGNOSIS — I5022 Chronic systolic (congestive) heart failure: Secondary | ICD-10-CM | POA: Diagnosis not present

## 2022-04-27 DIAGNOSIS — A419 Sepsis, unspecified organism: Secondary | ICD-10-CM | POA: Diagnosis not present

## 2022-04-27 DIAGNOSIS — J181 Lobar pneumonia, unspecified organism: Secondary | ICD-10-CM | POA: Diagnosis not present

## 2022-04-27 DIAGNOSIS — I2510 Atherosclerotic heart disease of native coronary artery without angina pectoris: Secondary | ICD-10-CM | POA: Diagnosis not present

## 2022-04-28 DIAGNOSIS — R079 Chest pain, unspecified: Secondary | ICD-10-CM | POA: Diagnosis not present

## 2022-04-28 DIAGNOSIS — N179 Acute kidney failure, unspecified: Secondary | ICD-10-CM | POA: Diagnosis not present

## 2022-04-28 DIAGNOSIS — J181 Lobar pneumonia, unspecified organism: Secondary | ICD-10-CM | POA: Diagnosis not present

## 2022-04-28 DIAGNOSIS — A419 Sepsis, unspecified organism: Secondary | ICD-10-CM | POA: Diagnosis not present

## 2022-05-01 ENCOUNTER — Telehealth: Payer: Self-pay | Admitting: Cardiology

## 2022-05-01 DIAGNOSIS — J189 Pneumonia, unspecified organism: Secondary | ICD-10-CM | POA: Diagnosis not present

## 2022-05-01 DIAGNOSIS — D692 Other nonthrombocytopenic purpura: Secondary | ICD-10-CM | POA: Diagnosis not present

## 2022-05-01 DIAGNOSIS — Z7901 Long term (current) use of anticoagulants: Secondary | ICD-10-CM | POA: Diagnosis not present

## 2022-05-01 DIAGNOSIS — I4891 Unspecified atrial fibrillation: Secondary | ICD-10-CM | POA: Diagnosis not present

## 2022-05-01 NOTE — Telephone Encounter (Signed)
See medication list as changes were made - list updated.  See care everywhere.    Please advise.    STOPPED -   ASA '81mg'$   Coreg 6.'25mg'$   Robaxin '500mg'$   Oxycodone '5mg'$     ADDED -   Eliquis '5mg'$  BID Jardiance '10mg'$  daily Toprol XL '25mg'$  daily

## 2022-05-01 NOTE — Telephone Encounter (Signed)
Laurine Blazer, LPN  05/06/6014  6:15 PM EDT Back to Top    Notified, copy to pcp.  Patient states that she was d/c from [facility] on 04/28/22.   Laurine Blazer, LPN  3/79/4327  6:14 PM EDT     Left message to return call.   Arnoldo Lenis, MD  04/23/2022  1:00 PM EDT     Some ongoing weakness of heart muscle, we may try some med changes at f/u. Can she f/u 2 months   Zandra Abts MD

## 2022-05-01 NOTE — Telephone Encounter (Signed)
Pt is returning call and is requesting a call back in regards to echo results.

## 2022-05-02 DIAGNOSIS — E039 Hypothyroidism, unspecified: Secondary | ICD-10-CM | POA: Diagnosis not present

## 2022-05-02 DIAGNOSIS — I5020 Unspecified systolic (congestive) heart failure: Secondary | ICD-10-CM | POA: Diagnosis not present

## 2022-05-02 DIAGNOSIS — D649 Anemia, unspecified: Secondary | ICD-10-CM | POA: Diagnosis not present

## 2022-05-02 DIAGNOSIS — E1122 Type 2 diabetes mellitus with diabetic chronic kidney disease: Secondary | ICD-10-CM | POA: Diagnosis not present

## 2022-05-02 DIAGNOSIS — M8580 Other specified disorders of bone density and structure, unspecified site: Secondary | ICD-10-CM | POA: Diagnosis not present

## 2022-05-02 DIAGNOSIS — I10 Essential (primary) hypertension: Secondary | ICD-10-CM | POA: Diagnosis not present

## 2022-05-02 DIAGNOSIS — E7849 Other hyperlipidemia: Secondary | ICD-10-CM | POA: Diagnosis not present

## 2022-05-03 NOTE — Telephone Encounter (Signed)
Patient notified and verbalized understanding. Appointment scheduled. 

## 2022-05-03 NOTE — Telephone Encounter (Signed)
Would continue the meds as changed during admission, needs f/u within 1 month  Zandra Abts MD

## 2022-05-10 DIAGNOSIS — J189 Pneumonia, unspecified organism: Secondary | ICD-10-CM | POA: Diagnosis not present

## 2022-05-16 DIAGNOSIS — I5020 Unspecified systolic (congestive) heart failure: Secondary | ICD-10-CM | POA: Diagnosis not present

## 2022-05-16 DIAGNOSIS — Z9889 Other specified postprocedural states: Secondary | ICD-10-CM | POA: Diagnosis not present

## 2022-05-16 DIAGNOSIS — D692 Other nonthrombocytopenic purpura: Secondary | ICD-10-CM | POA: Diagnosis not present

## 2022-05-16 DIAGNOSIS — I4821 Permanent atrial fibrillation: Secondary | ICD-10-CM | POA: Diagnosis not present

## 2022-05-16 DIAGNOSIS — R059 Cough, unspecified: Secondary | ICD-10-CM | POA: Diagnosis not present

## 2022-05-16 DIAGNOSIS — K2100 Gastro-esophageal reflux disease with esophagitis, without bleeding: Secondary | ICD-10-CM | POA: Diagnosis not present

## 2022-05-16 DIAGNOSIS — I700 Atherosclerosis of aorta: Secondary | ICD-10-CM | POA: Diagnosis not present

## 2022-05-16 DIAGNOSIS — E1122 Type 2 diabetes mellitus with diabetic chronic kidney disease: Secondary | ICD-10-CM | POA: Diagnosis not present

## 2022-05-16 DIAGNOSIS — F5105 Insomnia due to other mental disorder: Secondary | ICD-10-CM | POA: Diagnosis not present

## 2022-05-16 DIAGNOSIS — J449 Chronic obstructive pulmonary disease, unspecified: Secondary | ICD-10-CM | POA: Diagnosis not present

## 2022-05-16 DIAGNOSIS — M3500 Sicca syndrome, unspecified: Secondary | ICD-10-CM | POA: Diagnosis not present

## 2022-05-16 DIAGNOSIS — R4582 Worries: Secondary | ICD-10-CM | POA: Diagnosis not present

## 2022-05-17 DIAGNOSIS — I10 Essential (primary) hypertension: Secondary | ICD-10-CM | POA: Diagnosis not present

## 2022-05-17 DIAGNOSIS — E782 Mixed hyperlipidemia: Secondary | ICD-10-CM | POA: Diagnosis not present

## 2022-05-28 ENCOUNTER — Telehealth: Payer: Self-pay | Admitting: Cardiology

## 2022-05-28 DIAGNOSIS — D649 Anemia, unspecified: Secondary | ICD-10-CM | POA: Diagnosis not present

## 2022-05-28 DIAGNOSIS — E039 Hypothyroidism, unspecified: Secondary | ICD-10-CM | POA: Diagnosis not present

## 2022-05-28 DIAGNOSIS — M8580 Other specified disorders of bone density and structure, unspecified site: Secondary | ICD-10-CM | POA: Diagnosis not present

## 2022-05-28 DIAGNOSIS — I10 Essential (primary) hypertension: Secondary | ICD-10-CM | POA: Diagnosis not present

## 2022-05-28 DIAGNOSIS — E782 Mixed hyperlipidemia: Secondary | ICD-10-CM | POA: Diagnosis not present

## 2022-05-28 DIAGNOSIS — E1122 Type 2 diabetes mellitus with diabetic chronic kidney disease: Secondary | ICD-10-CM | POA: Diagnosis not present

## 2022-05-28 DIAGNOSIS — Z6823 Body mass index (BMI) 23.0-23.9, adult: Secondary | ICD-10-CM | POA: Diagnosis not present

## 2022-05-28 DIAGNOSIS — I5020 Unspecified systolic (congestive) heart failure: Secondary | ICD-10-CM | POA: Diagnosis not present

## 2022-05-28 NOTE — Telephone Encounter (Signed)
  April with [facility] cards calling, they are trying to get result of pt's stress test done on 02/19/2013, however, she did not receive it and she said its on [facility]

## 2022-05-28 NOTE — Telephone Encounter (Signed)
Spoke to April with [facility]- gave her the number for Medical Records to inquire about past stress test for patient.

## 2022-05-29 DIAGNOSIS — Z23 Encounter for immunization: Secondary | ICD-10-CM | POA: Diagnosis not present

## 2022-05-30 DIAGNOSIS — E782 Mixed hyperlipidemia: Secondary | ICD-10-CM | POA: Diagnosis not present

## 2022-05-30 DIAGNOSIS — I480 Paroxysmal atrial fibrillation: Secondary | ICD-10-CM | POA: Diagnosis not present

## 2022-05-30 DIAGNOSIS — I5020 Unspecified systolic (congestive) heart failure: Secondary | ICD-10-CM | POA: Diagnosis not present

## 2022-05-30 DIAGNOSIS — I951 Orthostatic hypotension: Secondary | ICD-10-CM | POA: Diagnosis not present

## 2022-06-12 DIAGNOSIS — Z515 Encounter for palliative care: Secondary | ICD-10-CM | POA: Diagnosis not present

## 2022-06-12 DIAGNOSIS — Z7901 Long term (current) use of anticoagulants: Secondary | ICD-10-CM | POA: Diagnosis not present

## 2022-06-12 DIAGNOSIS — D692 Other nonthrombocytopenic purpura: Secondary | ICD-10-CM | POA: Diagnosis not present

## 2022-06-12 DIAGNOSIS — M25511 Pain in right shoulder: Secondary | ICD-10-CM | POA: Diagnosis not present

## 2022-06-12 DIAGNOSIS — K5901 Slow transit constipation: Secondary | ICD-10-CM | POA: Diagnosis not present

## 2022-06-15 ENCOUNTER — Ambulatory Visit: Payer: HMO | Admitting: Student

## 2022-08-07 ENCOUNTER — Other Ambulatory Visit: Payer: Self-pay | Admitting: Gastroenterology

## 2022-09-18 DIAGNOSIS — M8580 Other specified disorders of bone density and structure, unspecified site: Secondary | ICD-10-CM | POA: Diagnosis not present

## 2022-09-18 DIAGNOSIS — M3501 Sicca syndrome with keratoconjunctivitis: Secondary | ICD-10-CM | POA: Diagnosis not present

## 2022-09-18 DIAGNOSIS — M25511 Pain in right shoulder: Secondary | ICD-10-CM | POA: Diagnosis not present

## 2022-09-18 DIAGNOSIS — Z6821 Body mass index (BMI) 21.0-21.9, adult: Secondary | ICD-10-CM | POA: Diagnosis not present

## 2022-09-18 DIAGNOSIS — Z7952 Long term (current) use of systemic steroids: Secondary | ICD-10-CM | POA: Diagnosis not present

## 2022-09-26 ENCOUNTER — Ambulatory Visit: Payer: HMO | Admitting: Cardiology

## 2022-10-23 DIAGNOSIS — H04123 Dry eye syndrome of bilateral lacrimal glands: Secondary | ICD-10-CM | POA: Diagnosis not present

## 2022-10-23 DIAGNOSIS — H35361 Drusen (degenerative) of macula, right eye: Secondary | ICD-10-CM | POA: Diagnosis not present

## 2022-10-23 DIAGNOSIS — H2513 Age-related nuclear cataract, bilateral: Secondary | ICD-10-CM | POA: Diagnosis not present

## 2022-10-23 DIAGNOSIS — Z79899 Other long term (current) drug therapy: Secondary | ICD-10-CM | POA: Diagnosis not present

## 2022-10-23 DIAGNOSIS — E119 Type 2 diabetes mellitus without complications: Secondary | ICD-10-CM | POA: Diagnosis not present

## 2022-11-21 ENCOUNTER — Encounter: Payer: Self-pay | Admitting: Internal Medicine

## 2022-11-26 DIAGNOSIS — I5032 Chronic diastolic (congestive) heart failure: Secondary | ICD-10-CM | POA: Diagnosis not present

## 2022-11-26 DIAGNOSIS — E782 Mixed hyperlipidemia: Secondary | ICD-10-CM | POA: Diagnosis not present

## 2022-11-26 DIAGNOSIS — I480 Paroxysmal atrial fibrillation: Secondary | ICD-10-CM | POA: Diagnosis not present

## 2022-11-30 DIAGNOSIS — H2513 Age-related nuclear cataract, bilateral: Secondary | ICD-10-CM | POA: Diagnosis not present

## 2022-11-30 DIAGNOSIS — M3500 Sicca syndrome, unspecified: Secondary | ICD-10-CM | POA: Diagnosis not present

## 2022-12-16 DIAGNOSIS — I10 Essential (primary) hypertension: Secondary | ICD-10-CM | POA: Diagnosis not present

## 2022-12-16 DIAGNOSIS — E782 Mixed hyperlipidemia: Secondary | ICD-10-CM | POA: Diagnosis not present

## 2022-12-25 DIAGNOSIS — J449 Chronic obstructive pulmonary disease, unspecified: Secondary | ICD-10-CM | POA: Diagnosis not present

## 2022-12-25 DIAGNOSIS — Z515 Encounter for palliative care: Secondary | ICD-10-CM | POA: Diagnosis not present

## 2022-12-25 DIAGNOSIS — D6869 Other thrombophilia: Secondary | ICD-10-CM | POA: Diagnosis not present

## 2022-12-25 DIAGNOSIS — Z87891 Personal history of nicotine dependence: Secondary | ICD-10-CM | POA: Diagnosis not present

## 2022-12-25 DIAGNOSIS — Z9181 History of falling: Secondary | ICD-10-CM | POA: Diagnosis not present

## 2022-12-25 DIAGNOSIS — I480 Paroxysmal atrial fibrillation: Secondary | ICD-10-CM | POA: Diagnosis not present

## 2022-12-28 DIAGNOSIS — H04123 Dry eye syndrome of bilateral lacrimal glands: Secondary | ICD-10-CM | POA: Diagnosis not present

## 2022-12-28 DIAGNOSIS — Z79899 Other long term (current) drug therapy: Secondary | ICD-10-CM | POA: Diagnosis not present

## 2022-12-28 DIAGNOSIS — H2513 Age-related nuclear cataract, bilateral: Secondary | ICD-10-CM | POA: Diagnosis not present

## 2022-12-28 DIAGNOSIS — M3500 Sicca syndrome, unspecified: Secondary | ICD-10-CM | POA: Diagnosis not present

## 2023-01-08 DIAGNOSIS — E7849 Other hyperlipidemia: Secondary | ICD-10-CM | POA: Diagnosis not present

## 2023-01-08 DIAGNOSIS — E1122 Type 2 diabetes mellitus with diabetic chronic kidney disease: Secondary | ICD-10-CM | POA: Diagnosis not present

## 2023-01-08 DIAGNOSIS — S63501A Unspecified sprain of right wrist, initial encounter: Secondary | ICD-10-CM | POA: Diagnosis not present

## 2023-01-08 DIAGNOSIS — N1831 Chronic kidney disease, stage 3a: Secondary | ICD-10-CM | POA: Diagnosis not present

## 2023-01-08 DIAGNOSIS — Z0001 Encounter for general adult medical examination with abnormal findings: Secondary | ICD-10-CM | POA: Diagnosis not present

## 2023-01-08 DIAGNOSIS — R030 Elevated blood-pressure reading, without diagnosis of hypertension: Secondary | ICD-10-CM | POA: Diagnosis not present

## 2023-01-08 DIAGNOSIS — E039 Hypothyroidism, unspecified: Secondary | ICD-10-CM | POA: Diagnosis not present

## 2023-01-08 DIAGNOSIS — E1165 Type 2 diabetes mellitus with hyperglycemia: Secondary | ICD-10-CM | POA: Diagnosis not present

## 2023-01-08 DIAGNOSIS — E1169 Type 2 diabetes mellitus with other specified complication: Secondary | ICD-10-CM | POA: Diagnosis not present

## 2023-01-08 DIAGNOSIS — E7801 Familial hypercholesterolemia: Secondary | ICD-10-CM | POA: Diagnosis not present

## 2023-01-08 DIAGNOSIS — E119 Type 2 diabetes mellitus without complications: Secondary | ICD-10-CM | POA: Diagnosis not present

## 2023-01-08 DIAGNOSIS — I10 Essential (primary) hypertension: Secondary | ICD-10-CM | POA: Diagnosis not present

## 2023-01-08 DIAGNOSIS — Z6823 Body mass index (BMI) 23.0-23.9, adult: Secondary | ICD-10-CM | POA: Diagnosis not present

## 2023-01-08 DIAGNOSIS — J449 Chronic obstructive pulmonary disease, unspecified: Secondary | ICD-10-CM | POA: Diagnosis not present

## 2023-01-08 DIAGNOSIS — K2100 Gastro-esophageal reflux disease with esophagitis, without bleeding: Secondary | ICD-10-CM | POA: Diagnosis not present

## 2023-01-15 DIAGNOSIS — I5020 Unspecified systolic (congestive) heart failure: Secondary | ICD-10-CM | POA: Diagnosis not present

## 2023-01-15 DIAGNOSIS — Z9889 Other specified postprocedural states: Secondary | ICD-10-CM | POA: Diagnosis not present

## 2023-01-15 DIAGNOSIS — N1831 Chronic kidney disease, stage 3a: Secondary | ICD-10-CM | POA: Diagnosis not present

## 2023-01-15 DIAGNOSIS — R4582 Worries: Secondary | ICD-10-CM | POA: Diagnosis not present

## 2023-01-15 DIAGNOSIS — I700 Atherosclerosis of aorta: Secondary | ICD-10-CM | POA: Diagnosis not present

## 2023-01-15 DIAGNOSIS — K2100 Gastro-esophageal reflux disease with esophagitis, without bleeding: Secondary | ICD-10-CM | POA: Diagnosis not present

## 2023-01-15 DIAGNOSIS — F331 Major depressive disorder, recurrent, moderate: Secondary | ICD-10-CM | POA: Diagnosis not present

## 2023-01-15 DIAGNOSIS — R030 Elevated blood-pressure reading, without diagnosis of hypertension: Secondary | ICD-10-CM | POA: Diagnosis not present

## 2023-01-15 DIAGNOSIS — F5105 Insomnia due to other mental disorder: Secondary | ICD-10-CM | POA: Diagnosis not present

## 2023-01-15 DIAGNOSIS — M3500 Sicca syndrome, unspecified: Secondary | ICD-10-CM | POA: Diagnosis not present

## 2023-01-15 DIAGNOSIS — D692 Other nonthrombocytopenic purpura: Secondary | ICD-10-CM | POA: Diagnosis not present

## 2023-01-15 DIAGNOSIS — Z0001 Encounter for general adult medical examination with abnormal findings: Secondary | ICD-10-CM | POA: Diagnosis not present

## 2023-01-15 DIAGNOSIS — J449 Chronic obstructive pulmonary disease, unspecified: Secondary | ICD-10-CM | POA: Diagnosis not present

## 2023-01-15 DIAGNOSIS — Z1212 Encounter for screening for malignant neoplasm of rectum: Secondary | ICD-10-CM | POA: Diagnosis not present

## 2023-01-15 DIAGNOSIS — E1122 Type 2 diabetes mellitus with diabetic chronic kidney disease: Secondary | ICD-10-CM | POA: Diagnosis not present

## 2023-01-15 DIAGNOSIS — Z23 Encounter for immunization: Secondary | ICD-10-CM | POA: Diagnosis not present

## 2023-01-15 DIAGNOSIS — I4821 Permanent atrial fibrillation: Secondary | ICD-10-CM | POA: Diagnosis not present

## 2023-01-16 DIAGNOSIS — E1122 Type 2 diabetes mellitus with diabetic chronic kidney disease: Secondary | ICD-10-CM | POA: Diagnosis not present

## 2023-01-16 DIAGNOSIS — E039 Hypothyroidism, unspecified: Secondary | ICD-10-CM | POA: Diagnosis not present

## 2023-01-16 DIAGNOSIS — J449 Chronic obstructive pulmonary disease, unspecified: Secondary | ICD-10-CM | POA: Diagnosis not present

## 2023-01-16 DIAGNOSIS — E7849 Other hyperlipidemia: Secondary | ICD-10-CM | POA: Diagnosis not present

## 2023-01-16 DIAGNOSIS — D649 Anemia, unspecified: Secondary | ICD-10-CM | POA: Diagnosis not present

## 2023-01-16 DIAGNOSIS — I509 Heart failure, unspecified: Secondary | ICD-10-CM | POA: Diagnosis not present

## 2023-01-16 DIAGNOSIS — Z0001 Encounter for general adult medical examination with abnormal findings: Secondary | ICD-10-CM | POA: Diagnosis not present

## 2023-01-16 DIAGNOSIS — N1831 Chronic kidney disease, stage 3a: Secondary | ICD-10-CM | POA: Diagnosis not present

## 2023-01-16 DIAGNOSIS — I10 Essential (primary) hypertension: Secondary | ICD-10-CM | POA: Diagnosis not present

## 2023-01-22 DIAGNOSIS — M25511 Pain in right shoulder: Secondary | ICD-10-CM | POA: Diagnosis not present

## 2023-01-23 DIAGNOSIS — Z1231 Encounter for screening mammogram for malignant neoplasm of breast: Secondary | ICD-10-CM | POA: Diagnosis not present

## 2023-01-24 DIAGNOSIS — Z86010 Personal history of colonic polyps: Secondary | ICD-10-CM | POA: Diagnosis not present

## 2023-01-24 DIAGNOSIS — Z1211 Encounter for screening for malignant neoplasm of colon: Secondary | ICD-10-CM | POA: Diagnosis not present

## 2023-01-24 DIAGNOSIS — Z800 Family history of malignant neoplasm of digestive organs: Secondary | ICD-10-CM | POA: Diagnosis not present

## 2023-01-28 DIAGNOSIS — M25531 Pain in right wrist: Secondary | ICD-10-CM | POA: Diagnosis not present

## 2023-01-28 DIAGNOSIS — M654 Radial styloid tenosynovitis [de Quervain]: Secondary | ICD-10-CM | POA: Diagnosis not present

## 2023-02-04 ENCOUNTER — Other Ambulatory Visit: Payer: Self-pay | Admitting: Gastroenterology

## 2023-02-12 ENCOUNTER — Encounter (INDEPENDENT_AMBULATORY_CARE_PROVIDER_SITE_OTHER): Payer: HMO | Admitting: Ophthalmology

## 2023-02-12 ENCOUNTER — Encounter (INDEPENDENT_AMBULATORY_CARE_PROVIDER_SITE_OTHER): Payer: Self-pay

## 2023-02-14 DIAGNOSIS — F330 Major depressive disorder, recurrent, mild: Secondary | ICD-10-CM | POA: Diagnosis not present

## 2023-02-14 DIAGNOSIS — M3500 Sicca syndrome, unspecified: Secondary | ICD-10-CM | POA: Diagnosis not present

## 2023-02-14 DIAGNOSIS — E1122 Type 2 diabetes mellitus with diabetic chronic kidney disease: Secondary | ICD-10-CM | POA: Diagnosis not present

## 2023-02-14 DIAGNOSIS — K5901 Slow transit constipation: Secondary | ICD-10-CM | POA: Diagnosis not present

## 2023-02-14 DIAGNOSIS — I480 Paroxysmal atrial fibrillation: Secondary | ICD-10-CM | POA: Diagnosis not present

## 2023-02-14 DIAGNOSIS — D692 Other nonthrombocytopenic purpura: Secondary | ICD-10-CM | POA: Diagnosis not present

## 2023-02-14 DIAGNOSIS — J449 Chronic obstructive pulmonary disease, unspecified: Secondary | ICD-10-CM | POA: Diagnosis not present

## 2023-02-14 DIAGNOSIS — I130 Hypertensive heart and chronic kidney disease with heart failure and stage 1 through stage 4 chronic kidney disease, or unspecified chronic kidney disease: Secondary | ICD-10-CM | POA: Diagnosis not present

## 2023-02-14 DIAGNOSIS — D6869 Other thrombophilia: Secondary | ICD-10-CM | POA: Diagnosis not present

## 2023-02-14 DIAGNOSIS — C8590 Non-Hodgkin lymphoma, unspecified, unspecified site: Secondary | ICD-10-CM | POA: Diagnosis not present

## 2023-02-14 DIAGNOSIS — N183 Chronic kidney disease, stage 3 unspecified: Secondary | ICD-10-CM | POA: Diagnosis not present

## 2023-02-14 DIAGNOSIS — I5042 Chronic combined systolic (congestive) and diastolic (congestive) heart failure: Secondary | ICD-10-CM | POA: Diagnosis not present

## 2023-02-15 DIAGNOSIS — Z7952 Long term (current) use of systemic steroids: Secondary | ICD-10-CM | POA: Diagnosis not present

## 2023-02-15 DIAGNOSIS — Z86010 Personal history of colonic polyps: Secondary | ICD-10-CM | POA: Diagnosis not present

## 2023-02-15 DIAGNOSIS — Z1211 Encounter for screening for malignant neoplasm of colon: Secondary | ICD-10-CM | POA: Diagnosis not present

## 2023-02-15 DIAGNOSIS — I130 Hypertensive heart and chronic kidney disease with heart failure and stage 1 through stage 4 chronic kidney disease, or unspecified chronic kidney disease: Secondary | ICD-10-CM | POA: Diagnosis not present

## 2023-02-15 DIAGNOSIS — D125 Benign neoplasm of sigmoid colon: Secondary | ICD-10-CM | POA: Diagnosis not present

## 2023-02-15 DIAGNOSIS — Z87891 Personal history of nicotine dependence: Secondary | ICD-10-CM | POA: Diagnosis not present

## 2023-02-15 DIAGNOSIS — K219 Gastro-esophageal reflux disease without esophagitis: Secondary | ICD-10-CM | POA: Diagnosis not present

## 2023-02-15 DIAGNOSIS — E7800 Pure hypercholesterolemia, unspecified: Secondary | ICD-10-CM | POA: Diagnosis not present

## 2023-02-15 DIAGNOSIS — J432 Centrilobular emphysema: Secondary | ICD-10-CM | POA: Diagnosis not present

## 2023-02-15 DIAGNOSIS — D123 Benign neoplasm of transverse colon: Secondary | ICD-10-CM | POA: Diagnosis not present

## 2023-02-15 DIAGNOSIS — D120 Benign neoplasm of cecum: Secondary | ICD-10-CM | POA: Diagnosis not present

## 2023-02-15 DIAGNOSIS — Z7901 Long term (current) use of anticoagulants: Secondary | ICD-10-CM | POA: Diagnosis not present

## 2023-02-15 DIAGNOSIS — I2510 Atherosclerotic heart disease of native coronary artery without angina pectoris: Secondary | ICD-10-CM | POA: Diagnosis not present

## 2023-02-15 DIAGNOSIS — D124 Benign neoplasm of descending colon: Secondary | ICD-10-CM | POA: Diagnosis not present

## 2023-02-15 DIAGNOSIS — I509 Heart failure, unspecified: Secondary | ICD-10-CM | POA: Diagnosis not present

## 2023-02-15 DIAGNOSIS — D122 Benign neoplasm of ascending colon: Secondary | ICD-10-CM | POA: Diagnosis not present

## 2023-02-15 DIAGNOSIS — E1140 Type 2 diabetes mellitus with diabetic neuropathy, unspecified: Secondary | ICD-10-CM | POA: Diagnosis not present

## 2023-02-15 DIAGNOSIS — M797 Fibromyalgia: Secondary | ICD-10-CM | POA: Diagnosis not present

## 2023-02-15 DIAGNOSIS — K635 Polyp of colon: Secondary | ICD-10-CM | POA: Diagnosis not present

## 2023-02-15 DIAGNOSIS — N182 Chronic kidney disease, stage 2 (mild): Secondary | ICD-10-CM | POA: Diagnosis not present

## 2023-02-15 DIAGNOSIS — K5730 Diverticulosis of large intestine without perforation or abscess without bleeding: Secondary | ICD-10-CM | POA: Diagnosis not present

## 2023-02-15 DIAGNOSIS — Z885 Allergy status to narcotic agent status: Secondary | ICD-10-CM | POA: Diagnosis not present

## 2023-02-15 DIAGNOSIS — E1122 Type 2 diabetes mellitus with diabetic chronic kidney disease: Secondary | ICD-10-CM | POA: Diagnosis not present

## 2023-02-15 DIAGNOSIS — Z7984 Long term (current) use of oral hypoglycemic drugs: Secondary | ICD-10-CM | POA: Diagnosis not present

## 2023-02-19 DIAGNOSIS — R030 Elevated blood-pressure reading, without diagnosis of hypertension: Secondary | ICD-10-CM | POA: Diagnosis not present

## 2023-02-19 DIAGNOSIS — Z6822 Body mass index (BMI) 22.0-22.9, adult: Secondary | ICD-10-CM | POA: Diagnosis not present

## 2023-02-19 DIAGNOSIS — R300 Dysuria: Secondary | ICD-10-CM | POA: Diagnosis not present

## 2023-02-19 DIAGNOSIS — N390 Urinary tract infection, site not specified: Secondary | ICD-10-CM | POA: Diagnosis not present

## 2023-02-21 DIAGNOSIS — Z20822 Contact with and (suspected) exposure to covid-19: Secondary | ICD-10-CM | POA: Diagnosis not present

## 2023-02-21 DIAGNOSIS — Z7984 Long term (current) use of oral hypoglycemic drugs: Secondary | ICD-10-CM | POA: Diagnosis not present

## 2023-02-21 DIAGNOSIS — J449 Chronic obstructive pulmonary disease, unspecified: Secondary | ICD-10-CM | POA: Diagnosis not present

## 2023-02-21 DIAGNOSIS — A419 Sepsis, unspecified organism: Secondary | ICD-10-CM | POA: Diagnosis not present

## 2023-02-21 DIAGNOSIS — I480 Paroxysmal atrial fibrillation: Secondary | ICD-10-CM | POA: Diagnosis not present

## 2023-02-21 DIAGNOSIS — I5032 Chronic diastolic (congestive) heart failure: Secondary | ICD-10-CM | POA: Diagnosis not present

## 2023-02-21 DIAGNOSIS — Z87891 Personal history of nicotine dependence: Secondary | ICD-10-CM | POA: Diagnosis not present

## 2023-02-21 DIAGNOSIS — R079 Chest pain, unspecified: Secondary | ICD-10-CM | POA: Diagnosis not present

## 2023-02-21 DIAGNOSIS — F4312 Post-traumatic stress disorder, chronic: Secondary | ICD-10-CM | POA: Diagnosis not present

## 2023-02-21 DIAGNOSIS — K219 Gastro-esophageal reflux disease without esophagitis: Secondary | ICD-10-CM | POA: Diagnosis not present

## 2023-02-21 DIAGNOSIS — Z885 Allergy status to narcotic agent status: Secondary | ICD-10-CM | POA: Diagnosis not present

## 2023-02-21 DIAGNOSIS — R6520 Severe sepsis without septic shock: Secondary | ICD-10-CM | POA: Diagnosis not present

## 2023-02-21 DIAGNOSIS — N182 Chronic kidney disease, stage 2 (mild): Secondary | ICD-10-CM | POA: Diagnosis not present

## 2023-02-21 DIAGNOSIS — I130 Hypertensive heart and chronic kidney disease with heart failure and stage 1 through stage 4 chronic kidney disease, or unspecified chronic kidney disease: Secondary | ICD-10-CM | POA: Diagnosis not present

## 2023-02-21 DIAGNOSIS — E876 Hypokalemia: Secondary | ICD-10-CM | POA: Diagnosis not present

## 2023-02-21 DIAGNOSIS — E1122 Type 2 diabetes mellitus with diabetic chronic kidney disease: Secondary | ICD-10-CM | POA: Diagnosis not present

## 2023-02-21 DIAGNOSIS — M797 Fibromyalgia: Secondary | ICD-10-CM | POA: Diagnosis not present

## 2023-02-21 DIAGNOSIS — M351 Other overlap syndromes: Secondary | ICD-10-CM | POA: Diagnosis not present

## 2023-02-21 DIAGNOSIS — Z1152 Encounter for screening for COVID-19: Secondary | ICD-10-CM | POA: Diagnosis not present

## 2023-02-21 DIAGNOSIS — N179 Acute kidney failure, unspecified: Secondary | ICD-10-CM | POA: Diagnosis not present

## 2023-02-21 DIAGNOSIS — N390 Urinary tract infection, site not specified: Secondary | ICD-10-CM | POA: Diagnosis not present

## 2023-02-21 DIAGNOSIS — E7800 Pure hypercholesterolemia, unspecified: Secondary | ICD-10-CM | POA: Diagnosis not present

## 2023-02-21 DIAGNOSIS — D631 Anemia in chronic kidney disease: Secondary | ICD-10-CM | POA: Diagnosis not present

## 2023-02-21 DIAGNOSIS — M3500 Sicca syndrome, unspecified: Secondary | ICD-10-CM | POA: Diagnosis not present

## 2023-02-21 DIAGNOSIS — I4891 Unspecified atrial fibrillation: Secondary | ICD-10-CM | POA: Diagnosis not present

## 2023-02-21 DIAGNOSIS — F331 Major depressive disorder, recurrent, moderate: Secondary | ICD-10-CM | POA: Diagnosis not present

## 2023-02-21 DIAGNOSIS — I10 Essential (primary) hypertension: Secondary | ICD-10-CM | POA: Diagnosis not present

## 2023-02-21 DIAGNOSIS — R059 Cough, unspecified: Secondary | ICD-10-CM | POA: Diagnosis not present

## 2023-02-21 DIAGNOSIS — G9341 Metabolic encephalopathy: Secondary | ICD-10-CM | POA: Diagnosis not present

## 2023-02-25 DIAGNOSIS — C8590 Non-Hodgkin lymphoma, unspecified, unspecified site: Secondary | ICD-10-CM | POA: Diagnosis not present

## 2023-02-25 DIAGNOSIS — R9389 Abnormal findings on diagnostic imaging of other specified body structures: Secondary | ICD-10-CM | POA: Diagnosis not present

## 2023-02-25 DIAGNOSIS — Z9049 Acquired absence of other specified parts of digestive tract: Secondary | ICD-10-CM | POA: Diagnosis not present

## 2023-02-25 DIAGNOSIS — I2510 Atherosclerotic heart disease of native coronary artery without angina pectoris: Secondary | ICD-10-CM | POA: Diagnosis not present

## 2023-02-25 DIAGNOSIS — J9811 Atelectasis: Secondary | ICD-10-CM | POA: Diagnosis not present

## 2023-02-25 DIAGNOSIS — J90 Pleural effusion, not elsewhere classified: Secondary | ICD-10-CM | POA: Diagnosis not present

## 2023-02-25 DIAGNOSIS — R918 Other nonspecific abnormal finding of lung field: Secondary | ICD-10-CM | POA: Diagnosis not present

## 2023-02-25 DIAGNOSIS — Z0389 Encounter for observation for other suspected diseases and conditions ruled out: Secondary | ICD-10-CM | POA: Diagnosis not present

## 2023-02-25 DIAGNOSIS — J432 Centrilobular emphysema: Secondary | ICD-10-CM | POA: Diagnosis not present

## 2023-02-25 DIAGNOSIS — Z87891 Personal history of nicotine dependence: Secondary | ICD-10-CM | POA: Diagnosis not present

## 2023-02-25 DIAGNOSIS — J438 Other emphysema: Secondary | ICD-10-CM | POA: Diagnosis not present

## 2023-02-25 DIAGNOSIS — R911 Solitary pulmonary nodule: Secondary | ICD-10-CM | POA: Diagnosis not present

## 2023-02-25 DIAGNOSIS — I700 Atherosclerosis of aorta: Secondary | ICD-10-CM | POA: Diagnosis not present

## 2023-02-28 DIAGNOSIS — R9389 Abnormal findings on diagnostic imaging of other specified body structures: Secondary | ICD-10-CM | POA: Diagnosis not present

## 2023-02-28 DIAGNOSIS — Z1231 Encounter for screening mammogram for malignant neoplasm of breast: Secondary | ICD-10-CM | POA: Diagnosis not present

## 2023-02-28 DIAGNOSIS — R911 Solitary pulmonary nodule: Secondary | ICD-10-CM | POA: Diagnosis not present

## 2023-02-28 DIAGNOSIS — Z87891 Personal history of nicotine dependence: Secondary | ICD-10-CM | POA: Diagnosis not present

## 2023-03-05 DIAGNOSIS — Z8619 Personal history of other infectious and parasitic diseases: Secondary | ICD-10-CM | POA: Diagnosis not present

## 2023-03-05 DIAGNOSIS — Z09 Encounter for follow-up examination after completed treatment for conditions other than malignant neoplasm: Secondary | ICD-10-CM | POA: Diagnosis not present

## 2023-03-06 DIAGNOSIS — H2511 Age-related nuclear cataract, right eye: Secondary | ICD-10-CM | POA: Diagnosis not present

## 2023-03-06 DIAGNOSIS — H25811 Combined forms of age-related cataract, right eye: Secondary | ICD-10-CM | POA: Diagnosis not present

## 2023-03-07 DIAGNOSIS — Z800 Family history of malignant neoplasm of digestive organs: Secondary | ICD-10-CM | POA: Diagnosis not present

## 2023-03-07 DIAGNOSIS — D369 Benign neoplasm, unspecified site: Secondary | ICD-10-CM | POA: Diagnosis not present

## 2023-03-19 DIAGNOSIS — M8580 Other specified disorders of bone density and structure, unspecified site: Secondary | ICD-10-CM | POA: Diagnosis not present

## 2023-03-19 DIAGNOSIS — Z6822 Body mass index (BMI) 22.0-22.9, adult: Secondary | ICD-10-CM | POA: Diagnosis not present

## 2023-03-19 DIAGNOSIS — M3501 Sicca syndrome with keratoconjunctivitis: Secondary | ICD-10-CM | POA: Diagnosis not present

## 2023-03-19 DIAGNOSIS — Z7952 Long term (current) use of systemic steroids: Secondary | ICD-10-CM | POA: Diagnosis not present

## 2023-03-19 DIAGNOSIS — M25511 Pain in right shoulder: Secondary | ICD-10-CM | POA: Diagnosis not present

## 2023-03-20 ENCOUNTER — Encounter (INDEPENDENT_AMBULATORY_CARE_PROVIDER_SITE_OTHER): Payer: Self-pay | Admitting: *Deleted

## 2023-03-25 DIAGNOSIS — H04123 Dry eye syndrome of bilateral lacrimal glands: Secondary | ICD-10-CM | POA: Diagnosis not present

## 2023-03-26 DIAGNOSIS — N1831 Chronic kidney disease, stage 3a: Secondary | ICD-10-CM | POA: Diagnosis not present

## 2023-03-26 DIAGNOSIS — F331 Major depressive disorder, recurrent, moderate: Secondary | ICD-10-CM | POA: Diagnosis not present

## 2023-03-26 DIAGNOSIS — J441 Chronic obstructive pulmonary disease with (acute) exacerbation: Secondary | ICD-10-CM | POA: Diagnosis not present

## 2023-03-26 DIAGNOSIS — I5020 Unspecified systolic (congestive) heart failure: Secondary | ICD-10-CM | POA: Diagnosis not present

## 2023-03-28 DIAGNOSIS — R935 Abnormal findings on diagnostic imaging of other abdominal regions, including retroperitoneum: Secondary | ICD-10-CM | POA: Diagnosis not present

## 2023-03-28 DIAGNOSIS — R9389 Abnormal findings on diagnostic imaging of other specified body structures: Secondary | ICD-10-CM | POA: Diagnosis not present

## 2023-04-02 DIAGNOSIS — D259 Leiomyoma of uterus, unspecified: Secondary | ICD-10-CM | POA: Diagnosis not present

## 2023-04-02 DIAGNOSIS — R9389 Abnormal findings on diagnostic imaging of other specified body structures: Secondary | ICD-10-CM | POA: Diagnosis not present

## 2023-04-02 DIAGNOSIS — R935 Abnormal findings on diagnostic imaging of other abdominal regions, including retroperitoneum: Secondary | ICD-10-CM | POA: Diagnosis not present

## 2023-04-17 DIAGNOSIS — E782 Mixed hyperlipidemia: Secondary | ICD-10-CM | POA: Diagnosis not present

## 2023-04-17 DIAGNOSIS — I10 Essential (primary) hypertension: Secondary | ICD-10-CM | POA: Diagnosis not present

## 2023-05-09 DIAGNOSIS — E119 Type 2 diabetes mellitus without complications: Secondary | ICD-10-CM | POA: Diagnosis not present

## 2023-05-09 DIAGNOSIS — E1165 Type 2 diabetes mellitus with hyperglycemia: Secondary | ICD-10-CM | POA: Diagnosis not present

## 2023-05-09 DIAGNOSIS — E7849 Other hyperlipidemia: Secondary | ICD-10-CM | POA: Diagnosis not present

## 2023-05-09 DIAGNOSIS — E039 Hypothyroidism, unspecified: Secondary | ICD-10-CM | POA: Diagnosis not present

## 2023-05-09 DIAGNOSIS — J441 Chronic obstructive pulmonary disease with (acute) exacerbation: Secondary | ICD-10-CM | POA: Diagnosis not present

## 2023-05-09 DIAGNOSIS — N1831 Chronic kidney disease, stage 3a: Secondary | ICD-10-CM | POA: Diagnosis not present

## 2023-05-14 ENCOUNTER — Telehealth: Payer: Self-pay | Admitting: *Deleted

## 2023-05-14 NOTE — Telephone Encounter (Signed)
Pt needs OV due to blood thinner and sometimes constipation

## 2023-05-14 NOTE — Telephone Encounter (Signed)
Procedure: Colonoscopy  Estimated body mass index is 24.37 kg/m as calculated from the following:   Height as of 03/26/22: 5\' 4"  (1.626 m).   Weight as of 03/26/22: 142 lb (64.4 kg).   Have you had a colonoscopy before?  yes  Do you have family history of colon cancer?  yes  Do you have a family history of polyps? yes  Previous colonoscopy with polyps removed? yes  Do you have a history colorectal cancer?   no  Are you diabetic?  Yes type 2   Do you have a prosthetic or mechanical heart valve? no  Do you have a pacemaker/defibrillator?   no  Have you had endocarditis/atrial fibrillation?  yes  Do you use supplemental oxygen/CPAP?  no  Have you had joint replacement within the last 12 months?  no  Do you tend to be constipated or have to use laxatives?  Yes sometimes   Do you have history of alcohol use? If yes, how much and how often.  no  Do you have history or are you using drugs? If yes, what do are you  using?  no  Have you ever had a stroke/heart attack?  no  Have you ever had a heart or other vascular stent placed,?no  Do you take weight loss medication? no  female patients,: have you had a hysterectomy? no                              are you post menopausal?  yes                              do you still have your menstrual cycle? no    Date of last menstrual period?   Do you take any blood-thinning medications such as: (Plavix, aspirin, Coumadin, Aggrenox, Brilinta, Xarelto, Eliquis, Pradaxa, Savaysa or Effient)? Yes eliquis Dr. Garner Nash  If yes we need the name, milligram, dosage and who is prescribing doctor:  no             Current Outpatient Medications  Medication Sig Dispense Refill   buPROPion (WELLBUTRIN XL) 150 MG 24 hr tablet Take 1 tablet by mouth daily.     Calcium Carb-Cholecalciferol (CALCIUM + D3) 600-800 MG-UNIT TABS Take by mouth 2 (two) times daily.     ELIQUIS 5 MG TABS tablet Take 5 mg by mouth 2 (two) times daily.     FLUoxetine  (PROZAC) 20 MG tablet Take 60 mg by mouth daily.     hydroxychloroquine (PLAQUENIL) 200 MG tablet Take 200 mg by mouth 2 (two) times daily.     hydrOXYzine (ATARAX/VISTARIL) 25 MG tablet Take 25 mg by mouth 3 (three) times daily as needed (Hives).     JARDIANCE 10 MG TABS tablet Take 10 mg by mouth daily.     LORazepam (ATIVAN) 1 MG tablet Take 1 tablet (1 mg total) by mouth 2 (two) times daily. 16 tablet 0   metoprolol succinate (TOPROL-XL) 25 MG 24 hr tablet Take 25 mg by mouth daily.     nitroGLYCERIN (NITROSTAT) 0.4 MG SL tablet Place 1 tablet (0.4 mg total) under the tongue every 5 (five) minutes x 3 doses as needed for chest pain (if no relief after 3rd dose, proceed to the ED for an evaluation or call 911). 25 tablet 3   pantoprazole (PROTONIX) 40 MG tablet TAKE 1 TABLET BY MOUTH  TWICE DAILY 30 MINUTES BEFORE BREAKFAST 60 tablet 5   potassium chloride SA (KLOR-CON M) 20 MEQ tablet Take 1 tablet (20 mEq total) by mouth daily as needed (when taking torsemide). 30 tablet 3   predniSONE (DELTASONE) 5 MG tablet Take 5 mg by mouth daily.      Propylene Glycol (SYSTANE COMPLETE OP) Place 1 drop into both eyes in the morning, at noon, in the evening, and at bedtime.     simvastatin (ZOCOR) 20 MG tablet TAKE 1 TABLET BY MOUTH ONCE DAILY (Patient taking differently: Take 20 mg by mouth at bedtime.) 30 tablet 0   torsemide (DEMADEX) 20 MG tablet Take 1 tablet (20 mg total) by mouth daily as needed (take of gain over 5 lbs). 30 tablet 3   No current facility-administered medications for this visit.    Allergies  Allergen Reactions   Codeine Camsylate [Codeine] Itching   Formaldehyde     Other reaction(s): Other (See Comments) Headache,dizziness, stinging in eyes   Nicotine Hives and Nausea And Vomiting

## 2023-05-16 DIAGNOSIS — N1831 Chronic kidney disease, stage 3a: Secondary | ICD-10-CM | POA: Diagnosis not present

## 2023-05-16 DIAGNOSIS — Z9889 Other specified postprocedural states: Secondary | ICD-10-CM | POA: Diagnosis not present

## 2023-05-16 DIAGNOSIS — F331 Major depressive disorder, recurrent, moderate: Secondary | ICD-10-CM | POA: Diagnosis not present

## 2023-05-16 DIAGNOSIS — R030 Elevated blood-pressure reading, without diagnosis of hypertension: Secondary | ICD-10-CM | POA: Diagnosis not present

## 2023-05-16 DIAGNOSIS — I4821 Permanent atrial fibrillation: Secondary | ICD-10-CM | POA: Diagnosis not present

## 2023-05-16 DIAGNOSIS — E1122 Type 2 diabetes mellitus with diabetic chronic kidney disease: Secondary | ICD-10-CM | POA: Diagnosis not present

## 2023-05-16 DIAGNOSIS — R4582 Worries: Secondary | ICD-10-CM | POA: Diagnosis not present

## 2023-05-16 DIAGNOSIS — I5020 Unspecified systolic (congestive) heart failure: Secondary | ICD-10-CM | POA: Diagnosis not present

## 2023-05-16 DIAGNOSIS — F5105 Insomnia due to other mental disorder: Secondary | ICD-10-CM | POA: Diagnosis not present

## 2023-05-16 DIAGNOSIS — I700 Atherosclerosis of aorta: Secondary | ICD-10-CM | POA: Diagnosis not present

## 2023-05-16 DIAGNOSIS — D692 Other nonthrombocytopenic purpura: Secondary | ICD-10-CM | POA: Diagnosis not present

## 2023-05-21 DIAGNOSIS — A419 Sepsis, unspecified organism: Secondary | ICD-10-CM | POA: Diagnosis not present

## 2023-05-21 DIAGNOSIS — R413 Other amnesia: Secondary | ICD-10-CM | POA: Diagnosis not present

## 2023-06-14 DIAGNOSIS — Z23 Encounter for immunization: Secondary | ICD-10-CM | POA: Diagnosis not present

## 2023-06-18 ENCOUNTER — Other Ambulatory Visit: Payer: Self-pay | Admitting: *Deleted

## 2023-06-18 ENCOUNTER — Encounter: Payer: Self-pay | Admitting: Gastroenterology

## 2023-06-18 ENCOUNTER — Encounter: Payer: Self-pay | Admitting: *Deleted

## 2023-06-18 ENCOUNTER — Ambulatory Visit: Payer: HMO | Admitting: Gastroenterology

## 2023-06-18 VITALS — BP 103/59 | HR 82 | Temp 98.2°F | Ht 64.0 in | Wt 138.2 lb

## 2023-06-18 DIAGNOSIS — K5900 Constipation, unspecified: Secondary | ICD-10-CM

## 2023-06-18 DIAGNOSIS — Z800 Family history of malignant neoplasm of digestive organs: Secondary | ICD-10-CM | POA: Insufficient documentation

## 2023-06-18 DIAGNOSIS — Z860100 Personal history of colon polyps, unspecified: Secondary | ICD-10-CM | POA: Diagnosis not present

## 2023-06-18 DIAGNOSIS — R1310 Dysphagia, unspecified: Secondary | ICD-10-CM | POA: Diagnosis not present

## 2023-06-18 MED ORDER — PEG 3350-KCL-NA BICARB-NACL 420 G PO SOLR: 4000.0000 mL | Freq: Once | ORAL | 0 refills | Status: AC

## 2023-06-18 NOTE — Patient Instructions (Signed)
For constipation: you can take Benefiber or Metamucil daily. Add Miralax each day as needed to have a good bowel movement.  We are arranging a colonoscopy, upper endoscopy, and dilation in the near future with Dr. Marletta Lor.  You will need to stop the Eliquis 2 days before and the Jardiance 3 days before the procedure.  Further recommendations to follow!  I enjoyed seeing you again today! I value our relationship and want to provide genuine, compassionate, and quality care. You may receive a survey regarding your visit with me, and I welcome your feedback! Thanks so much for taking the time to complete this. I look forward to seeing you again.      Gelene Mink, PhD, ANP-BC [facility]
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
## 2023-07-30 NOTE — Patient Instructions (Signed)
[facility]  07/30/2023     @PREFPERIOPPHARMACY @   Your procedure is scheduled on  08/05/2023.   Report to [facility] at  0815 A.M.   Call this number if you have problems the morning of surgery:  [phone]  If you experience any cold or flu symptoms such as cough, fever, chills, shortness of breath, etc. between now and your scheduled surgery, please notify us at the above number.   Remember:      Your last dose of jardiance should be on 08/01/2023.      Your last dose of eliquis should be on 08/02/2023.    Follow the diet and prep instructions given to you by the office.    You may drink clear liquids until 0615 am on 08/05/2023.   Clear liquids allowed are:                    Water, Juice (No red color; non-citric and without pulp; diabetics please choose diet or no sugar options), Carbonated beverages (diabetics please choose diet or no sugar options), Clear Tea (No creamer, milk, or cream, including half & half and powdered creamer), Black Coffee Only (No creamer, milk or cream, including half & half and powdered creamer), and Clear Sports drink (No red color; diabetics please choose diet or no sugar options)    Take these medicines the morning of surgery with A SIP OF WATER        bupropion, fluoxetine, hydroxyzine, lorazepam (if needed), metoprolol, pantoprazole, prednisone.    Do not wear jewelry, make-up or nail polish, including gel polish,  artificial nails, or any other type of covering on natural nails (fingers and  toes).  Do not wear lotions, powders, or perfumes, or deodorant.  Do not shave 48 hours prior to surgery.  Men may shave face and neck.  Do not bring valuables to the hospital.  [facility] is not responsible for any belongings or valuables.  Contacts, dentures or bridgework may not be worn into surgery.  Leave your suitcase in the car.  After surgery it may be brought to your room.  For patients admitted to the hospital,  discharge time will be determined by your treatment team.  Patients discharged the day of surgery will not be allowed to drive home and must have someone with them for 24 hours.    Special instructions:   DO NOT smoke tobacco or vape for 24 hours before your procedure.  Please read over the following fact sheets that you were given. Anesthesia Post-op Instructions and Care and Recovery After Surgery      Upper Endoscopy, Adult, Care After After the procedure, it is common to have a sore throat. It is also common to have: Mild stomach pain or discomfort. Bloating. Nausea. Follow these instructions at home: The instructions below may help you care for yourself at home. Your health care provider may give you more instructions. If you have questions, ask your health care provider. If you were given a sedative during the procedure, it can affect you for several hours. Do not drive or operate machinery until your health care provider says that it is safe. If you will be going home right after the procedure, plan to have a responsible adult: Take you home from the hospital or clinic. You will not be allowed to drive. Care for you for the time you are told. Follow instructions from your health care provider  about what you may eat and drink. Return to your normal activities as told by your health care provider. Ask your health care provider what activities are safe for you. Take over-the-counter and prescription medicines only as told by your health care provider. Contact a health care provider if you: Have a sore throat that lasts longer than one day. Have trouble swallowing. Have a fever. Get help right away if you: Vomit blood or your vomit looks like coffee grounds. Have bloody, black, or tarry stools. Have a very bad sore throat or you cannot swallow. Have difficulty breathing or very bad pain in your chest or abdomen. These symptoms may be an emergency. Get help right away. Call  911. Do not wait to see if the symptoms will go away. Do not drive yourself to the hospital. Summary After the procedure, it is common to have a sore throat, mild stomach discomfort, bloating, and nausea. If you were given a sedative during the procedure, it can affect you for several hours. Do not drive until your health care provider says that it is safe. Follow instructions from your health care provider about what you may eat and drink. Return to your normal activities as told by your health care provider. This information is not intended to replace advice given to you by your health care provider. Make sure you discuss any questions you have with your health care provider. Document Revised: 12/13/2021 Document Reviewed: 12/13/2021 Elsevier Patient Education  2024 [employer] Esophageal Dilatation Esophageal dilatation, also called esophageal dilation, is a procedure to widen or open a blocked or narrowed part of the esophagus. The esophagus is the part of the body that moves food and liquid from the mouth to the stomach. You may need this procedure if: You have a buildup of scar tissue in your esophagus that makes it difficult, painful, or impossible to swallow. This can be caused by gastroesophageal reflux disease (GERD). You have cancer of the esophagus. There is a problem with how food moves through your esophagus. In some cases, you may need this procedure repeated at a later time to dilate the esophagus gradually. Tell a health care provider about: Any allergies you have. All medicines you are taking, including vitamins, herbs, eye drops, creams, and over-the-counter medicines. Any problems you or family members have had with anesthetic medicines. Any blood disorders you have. Any surgeries you have had. Any medical conditions you have. Any antibiotic medicines you are required to take before dental procedures. Whether you are pregnant or may be pregnant. What are the  risks? Generally, this is a safe procedure. However, problems may occur, including: Bleeding due to a tear in the lining of the esophagus. A hole, or perforation, in the esophagus. What happens before the procedure? Ask your health care provider about: Changing or stopping your regular medicines. This is especially important if you are taking diabetes medicines or blood thinners. Taking medicines such as aspirin and ibuprofen. These medicines can thin your blood. Do not take these medicines unless your health care provider tells you to take them. Taking over-the-counter medicines, vitamins, herbs, and supplements. Follow instructions from your health care provider about eating or drinking restrictions. Plan to have a responsible adult take you home from the hospital or clinic. Plan to have a responsible adult care for you for the time you are told after you leave the hospital or clinic. This is important. What happens during the procedure? You may be given a medicine to help you relax (  sedative). A numbing medicine may be sprayed into the back of your throat, or you may gargle the medicine. Your health care provider may perform the dilatation using various surgical instruments, such as: Simple dilators. This instrument is carefully placed in the esophagus to stretch it. Guided wire bougies. This involves using an endoscope to insert a wire into the esophagus. A dilator is passed over this wire to enlarge the esophagus. Then the wire is removed. Balloon dilators. An endoscope with a small balloon is inserted into the esophagus. The balloon is inflated to stretch the esophagus and open it up. The procedure may vary among health care providers and hospitals. What can I expect after the procedure? Your blood pressure, heart rate, breathing rate, and blood oxygen level will be monitored until you leave the hospital or clinic. Your throat may feel slightly sore and numb. This will get better over  time. You will not be allowed to eat or drink until your throat is no longer numb. When you are able to drink, urinate, and sit on the edge of the bed without nausea or dizziness, you may be able to return home. Follow these instructions at home: Take over-the-counter and prescription medicines only as told by your health care provider. If you were given a sedative during the procedure, it can affect you for several hours. Do not drive or operate machinery until your health care provider says that it is safe. Plan to have a responsible adult care for you for the time you are told. This is important. Follow instructions from your health care provider about any eating or drinking restrictions. Do not use any products that contain nicotine or tobacco, such as cigarettes, e-cigarettes, and chewing tobacco. If you need help quitting, ask your health care provider. Keep all follow-up visits. This is important. Contact a health care provider if: You have a fever. You have pain that is not relieved by medicine. Get help right away if: You have chest pain. You have trouble breathing. You have trouble swallowing. You vomit blood. You have black, tarry, or bloody stools. These symptoms may represent a serious problem that is an emergency. Do not wait to see if the symptoms will go away. Get medical help right away. Call your local emergency services (911 in the [place] Do not drive yourself to the hospital. Summary Esophageal dilatation, also called esophageal dilation, is a procedure to widen or open a blocked or narrowed part of the esophagus. Plan to have a responsible adult take you home from the hospital or clinic. For this procedure, a numbing medicine may be sprayed into the back of your throat, or you may gargle the medicine. Do not drive or operate machinery until your health care provider says that it is safe. This information is not intended to replace advice given to you by your health care  provider. Make sure you discuss any questions you have with your health care provider. Document Revised: 01/20/2020 Document Reviewed: 01/20/2020 Elsevier Patient Education  2024 [employer] Colonoscopy, Adult, Care After The following information offers guidance on how to care for yourself after your procedure. Your health care provider may also give you more specific instructions. If you have problems or questions, contact your health care provider. What can I expect after the procedure? After the procedure, it is common to have: A small amount of blood in your stool for 24 hours after the procedure. Some gas. Mild cramping or bloating of your abdomen. Follow these instructions at home:  Eating and drinking  Drink enough fluid to keep your urine pale yellow. Follow instructions from your health care provider about eating or drinking restrictions. Resume your normal diet as told by your health care provider. Avoid heavy or fried foods that are hard to digest. Activity Rest as told by your health care provider. Avoid sitting for a long time without moving. Get up to take short walks every 1-2 hours. This is important to improve blood flow and breathing. Ask for help if you feel weak or unsteady. Return to your normal activities as told by your health care provider. Ask your health care provider what activities are safe for you. Managing cramping and bloating  Try walking around when you have cramps or feel bloated. If directed, apply heat to your abdomen as told by your health care provider. Use the heat source that your health care provider recommends, such as a moist heat pack or a heating pad. Place a towel between your skin and the heat source. Leave the heat on for 20-30 minutes. Remove the heat if your skin turns bright red. This is especially important if you are unable to feel pain, heat, or cold. You have a greater risk of getting burned. General instructions If you were given  a sedative during the procedure, it can affect you for several hours. Do not drive or operate machinery until your health care provider says that it is safe. For the first 24 hours after the procedure: Do not sign important documents. Do not drink alcohol. Do your regular daily activities at a slower pace than normal. Eat soft foods that are easy to digest. Take over-the-counter and prescription medicines only as told by your health care provider. Keep all follow-up visits. This is important. Contact a health care provider if: You have blood in your stool 2-3 days after the procedure. Get help right away if: You have more than a small spotting of blood in your stool. You have large blood clots in your stool. You have swelling of your abdomen. You have nausea or vomiting. You have a fever. You have increasing pain in your abdomen that is not relieved with medicine. These symptoms may be an emergency. Get help right away. Call 911. Do not wait to see if the symptoms will go away. Do not drive yourself to the hospital. Summary After the procedure, it is common to have a small amount of blood in your stool. You may also have mild cramping and bloating of your abdomen. If you were given a sedative during the procedure, it can affect you for several hours. Do not drive or operate machinery until your health care provider says that it is safe. Get help right away if you have a lot of blood in your stool, nausea or vomiting, a fever, or increased pain in your abdomen. This information is not intended to replace advice given to you by your health care provider. Make sure you discuss any questions you have with your health care provider. Document Revised: 10/16/2022 Document Reviewed: 04/26/2021 Elsevier Patient Education  2024 [employer] Inc. Monitored Anesthesia Care, Care After The following information offers guidance on how to care for yourself after your procedure. Your health care provider  may also give you more specific instructions. If you have problems or questions, contact your health care provider. What can I expect after the procedure? After the procedure, it is common to have: Tiredness. Little or no memory about what happened during or after the procedure. Impaired judgment  when it comes to making decisions. Nausea or vomiting. Some trouble with balance. Follow these instructions at home: For the time period you were told by your health care provider:  Rest. Do not participate in activities where you could fall or become injured. Do not drive or use machinery. Do not drink alcohol. Do not take sleeping pills or medicines that cause drowsiness. Do not make important decisions or sign legal documents. Do not take care of children on your own. Medicines Take over-the-counter and prescription medicines only as told by your health care provider. If you were prescribed antibiotics, take them as told by your health care provider. Do not stop using the antibiotic even if you start to feel better. Eating and drinking Follow instructions from your health care provider about what you may eat and drink. Drink enough fluid to keep your urine pale yellow. If you vomit: Drink clear fluids slowly and in small amounts as you are able. Clear fluids include water, ice chips, low-calorie sports drinks, and fruit juice that has water added to it (diluted fruit juice). Eat light and bland foods in small amounts as you are able. These foods include bananas, applesauce, rice, lean meats, toast, and crackers. General instructions  Have a responsible adult stay with you for the time you are told. It is important to have someone help care for you until you are awake and alert. If you have sleep apnea, surgery and some medicines can increase your risk for breathing problems. Follow instructions from your health care provider about wearing your sleep device: When you are sleeping. This  includes during daytime naps. While taking prescription pain medicines, sleeping medicines, or medicines that make you drowsy. Do not use any products that contain nicotine or tobacco. These products include cigarettes, chewing tobacco, and vaping devices, such as e-cigarettes. If you need help quitting, ask your health care provider. Contact a health care provider if: You feel nauseous or vomit every time you eat or drink. You feel light-headed. You are still sleepy or having trouble with balance after 24 hours. You get a rash. You have a fever. You have redness or swelling around the IV site. Get help right away if: You have trouble breathing. You have new confusion after you get home. These symptoms may be an emergency. Get help right away. Call 911. Do not wait to see if the symptoms will go away. Do not drive yourself to the hospital. This information is not intended to replace advice given to you by your health care provider. Make sure you discuss any questions you have with your health care provider. Document Revised: 01/29/2022 Document Reviewed: 01/29/2022 Elsevier Patient Education  2024 [employer].

## 2023-08-01 ENCOUNTER — Encounter (HOSPITAL_COMMUNITY): Payer: Self-pay

## 2023-08-01 ENCOUNTER — Encounter (HOSPITAL_COMMUNITY)
Admission: RE | Admit: 2023-08-01 | Discharge: 2023-08-01 | Disposition: A | Discharge status: Home / Self Care | Payer: PPO | Source: Ambulatory Visit | Attending: Internal Medicine | Admitting: Internal Medicine

## 2023-08-01 VITALS — BP 108/49 | HR 82 | Resp 14 | Ht 64.0 in | Wt 138.2 lb

## 2023-08-01 DIAGNOSIS — E119 Type 2 diabetes mellitus without complications: Secondary | ICD-10-CM | POA: Diagnosis not present

## 2023-08-01 DIAGNOSIS — I5022 Chronic systolic (congestive) heart failure: Secondary | ICD-10-CM | POA: Diagnosis not present

## 2023-08-01 DIAGNOSIS — D649 Anemia, unspecified: Secondary | ICD-10-CM | POA: Diagnosis not present

## 2023-08-01 DIAGNOSIS — Z01818 Encounter for other preprocedural examination: Secondary | ICD-10-CM | POA: Diagnosis not present

## 2023-08-01 HISTORY — DX: Other complications of anesthesia, initial encounter: T88.59XA

## 2023-08-01 LAB — CBC WITH DIFFERENTIAL/PLATELET
Abs Immature Granulocytes: 0.02 10*3/uL (ref 0.00–0.07)
Basophils Absolute: 0 10*3/uL (ref 0.0–0.1)
Basophils Relative: 1 %
Eosinophils Absolute: 0.1 10*3/uL (ref 0.0–0.5)
Eosinophils Relative: 1 %
HCT: 40.4 % (ref 36.0–46.0)
Hemoglobin: 12.3 g/dL (ref 12.0–15.0)
Immature Granulocytes: 0 %
Lymphocytes Relative: 20 %
Lymphs Abs: 1 10*3/uL (ref 0.7–4.0)
MCH: 29.7 pg (ref 26.0–34.0)
MCHC: 30.4 g/dL (ref 30.0–36.0)
MCV: 97.6 fL (ref 80.0–100.0)
Monocytes Absolute: 0.4 10*3/uL (ref 0.1–1.0)
Monocytes Relative: 8 %
Neutro Abs: 3.4 10*3/uL (ref 1.7–7.7)
Neutrophils Relative %: 70 %
Platelets: 238 10*3/uL (ref 150–400)
RBC: 4.14 MIL/uL (ref 3.87–5.11)
RDW: 14.5 % (ref 11.5–15.5)
WBC: 4.9 10*3/uL (ref 4.0–10.5)
nRBC: 0 % (ref 0.0–0.2)

## 2023-08-01 LAB — BASIC METABOLIC PANEL
Anion gap: 10 (ref 5–15)
BUN: 20 mg/dL (ref 8–23)
CO2: 26 mmol/L (ref 22–32)
Calcium: 9.5 mg/dL (ref 8.9–10.3)
Chloride: 104 mmol/L (ref 98–111)
Creatinine, Ser: 1.39 mg/dL — ABNORMAL HIGH (ref 0.44–1.00)
GFR, Estimated: 40 mL/min — ABNORMAL LOW (ref >60)
Glucose, Bld: 108 mg/dL — ABNORMAL HIGH (ref 70–99)
Potassium: 3.8 mmol/L (ref 3.5–5.1)
Sodium: 140 mmol/L (ref 135–145)

## 2023-08-05 ENCOUNTER — Ambulatory Visit (HOSPITAL_BASED_OUTPATIENT_CLINIC_OR_DEPARTMENT_OTHER): Payer: PPO | Admitting: Certified Registered"

## 2023-08-05 ENCOUNTER — Ambulatory Visit (HOSPITAL_COMMUNITY)
Admission: RE | Admit: 2023-08-05 | Discharge: 2023-08-05 | Disposition: A | Discharge status: Home / Self Care | Payer: PPO | Attending: Internal Medicine | Admitting: Internal Medicine

## 2023-08-05 ENCOUNTER — Encounter (HOSPITAL_COMMUNITY): Payer: Self-pay

## 2023-08-05 ENCOUNTER — Ambulatory Visit (HOSPITAL_COMMUNITY): Payer: PPO | Admitting: Certified Registered"

## 2023-08-05 ENCOUNTER — Encounter (HOSPITAL_COMMUNITY)
Admission: RE | Disposition: A | Discharge status: Home / Self Care | Payer: Self-pay | Source: Home / Self Care | Attending: Internal Medicine

## 2023-08-05 DIAGNOSIS — I509 Heart failure, unspecified: Secondary | ICD-10-CM | POA: Insufficient documentation

## 2023-08-05 DIAGNOSIS — K449 Diaphragmatic hernia without obstruction or gangrene: Secondary | ICD-10-CM

## 2023-08-05 DIAGNOSIS — K2950 Unspecified chronic gastritis without bleeding: Secondary | ICD-10-CM | POA: Diagnosis not present

## 2023-08-05 DIAGNOSIS — K2970 Gastritis, unspecified, without bleeding: Secondary | ICD-10-CM | POA: Diagnosis not present

## 2023-08-05 DIAGNOSIS — F418 Other specified anxiety disorders: Secondary | ICD-10-CM | POA: Insufficient documentation

## 2023-08-05 DIAGNOSIS — Z1211 Encounter for screening for malignant neoplasm of colon: Secondary | ICD-10-CM | POA: Diagnosis not present

## 2023-08-05 DIAGNOSIS — K219 Gastro-esophageal reflux disease without esophagitis: Secondary | ICD-10-CM | POA: Insufficient documentation

## 2023-08-05 DIAGNOSIS — Z7984 Long term (current) use of oral hypoglycemic drugs: Secondary | ICD-10-CM | POA: Diagnosis not present

## 2023-08-05 DIAGNOSIS — Z860100 Personal history of colon polyps, unspecified: Secondary | ICD-10-CM | POA: Diagnosis not present

## 2023-08-05 DIAGNOSIS — Z860101 Personal history of adenomatous and serrated colon polyps: Secondary | ICD-10-CM | POA: Insufficient documentation

## 2023-08-05 DIAGNOSIS — R1310 Dysphagia, unspecified: Secondary | ICD-10-CM

## 2023-08-05 DIAGNOSIS — Z87891 Personal history of nicotine dependence: Secondary | ICD-10-CM | POA: Diagnosis not present

## 2023-08-05 DIAGNOSIS — E119 Type 2 diabetes mellitus without complications: Secondary | ICD-10-CM | POA: Diagnosis not present

## 2023-08-05 DIAGNOSIS — K5140 Inflammatory polyps of colon without complications: Secondary | ICD-10-CM

## 2023-08-05 DIAGNOSIS — K635 Polyp of colon: Secondary | ICD-10-CM | POA: Diagnosis not present

## 2023-08-05 DIAGNOSIS — K648 Other hemorrhoids: Secondary | ICD-10-CM | POA: Diagnosis not present

## 2023-08-05 HISTORY — PX: BIOPSY: SHX5522

## 2023-08-05 HISTORY — PX: COLONOSCOPY WITH PROPOFOL: SHX5780

## 2023-08-05 HISTORY — PX: ESOPHAGOGASTRODUODENOSCOPY (EGD) WITH PROPOFOL: SHX5813

## 2023-08-05 HISTORY — PX: POLYPECTOMY: SHX5525

## 2023-08-05 LAB — GLUCOSE, CAPILLARY: Glucose-Capillary: 83 mg/dL (ref 70–99)

## 2023-08-05 SURGERY — COLONOSCOPY WITH PROPOFOL
Anesthesia: General

## 2023-08-05 MED ORDER — PROPOFOL 10 MG/ML IV BOLUS
INTRAVENOUS | Status: DC | PRN
  Administered 2023-08-05 (×3): 50 mg via INTRAVENOUS
  Administered 2023-08-05: 100 mg via INTRAVENOUS
  Administered 2023-08-05: 50 mg via INTRAVENOUS

## 2023-08-05 MED ORDER — LACTATED RINGERS IV SOLN: INTRAVENOUS | Status: DC | PRN

## 2023-08-05 MED ORDER — LIDOCAINE HCL (PF) 2 % IJ SOLN
INTRAMUSCULAR | Status: DC | PRN
  Administered 2023-08-05: 50 mg via INTRADERMAL

## 2023-08-05 MED ORDER — PROPOFOL 500 MG/50ML IV EMUL
INTRAVENOUS | Status: DC | PRN
  Administered 2023-08-05: 150 ug/kg/min via INTRAVENOUS

## 2023-08-05 MED ORDER — PHENYLEPHRINE 80 MCG/ML (10ML) SYRINGE FOR IV PUSH (FOR BLOOD PRESSURE SUPPORT)
PREFILLED_SYRINGE | INTRAVENOUS | Status: DC | PRN
  Administered 2023-08-05 (×4): 160 ug via INTRAVENOUS

## 2023-08-05 MED ORDER — PHENYLEPHRINE 80 MCG/ML (10ML) SYRINGE FOR IV PUSH (FOR BLOOD PRESSURE SUPPORT)
PREFILLED_SYRINGE | INTRAVENOUS | Status: AC
Start: 2023-08-05 — End: ?
  Filled 2023-08-05: qty 10

## 2023-08-05 NOTE — Anesthesia Procedure Notes (Signed)
Date/Time: 08/05/2023 10:36 AM  Performed by: Julian Reil, CRNAPre-anesthesia Checklist: Patient identified, Emergency Drugs available, Suction available and Patient being monitored Patient Re-evaluated:Patient Re-evaluated prior to induction Oxygen Delivery Method: Nasal cannula Induction Type: IV induction Placement Confirmation: positive ETCO2

## 2023-08-05 NOTE — Discharge Instructions (Addendum)
EGD Discharge instructions Please read the instructions outlined below and refer to this sheet in the next few weeks. These discharge instructions provide you with general information on caring for yourself after you leave the hospital. Your doctor may also give you specific instructions. While your treatment has been planned according to the most current medical practices available, unavoidable complications occasionally occur. If you have any problems or questions after discharge, please call your doctor. ACTIVITY You may resume your regular activity but move at a slower pace for the next 24 hours.  Take frequent rest periods for the next 24 hours.  Walking will help expel (get rid of) the air and reduce the bloated feeling in your abdomen.  No driving for 24 hours (because of the anesthesia (medicine) used during the test).  You may shower.  Do not sign any important legal documents or operate any machinery for 24 hours (because of the anesthesia used during the test).  NUTRITION Drink plenty of fluids.  You may resume your normal diet.  Begin with a light meal and progress to your normal diet.  Avoid alcoholic beverages for 24 hours or as instructed by your caregiver.  MEDICATIONS You may resume your normal medications unless your caregiver tells you otherwise.  WHAT YOU CAN EXPECT TODAY You may experience abdominal discomfort such as a feeling of fullness or "gas" pains.  FOLLOW-UP Your doctor will discuss the results of your test with you.  SEEK IMMEDIATE MEDICAL ATTENTION IF ANY OF THE FOLLOWING OCCUR: Excessive nausea (feeling sick to your stomach) and/or vomiting.  Severe abdominal pain and distention (swelling).  Trouble swallowing.  Temperature over 101 F (37.8 C).  Rectal bleeding or vomiting of blood.      Colonoscopy Discharge Instructions  Read the instructions outlined below and refer to this sheet in the next few weeks. These discharge instructions provide you  with general information on caring for yourself after you leave the hospital. Your doctor may also give you specific instructions. While your treatment has been planned according to the most current medical practices available, unavoidable complications occasionally occur.   ACTIVITY You may resume your regular activity, but move at a slower pace for the next 24 hours.  Take frequent rest periods for the next 24 hours.  Walking will help get rid of the air and reduce the bloated feeling in your belly (abdomen).  No driving for 24 hours (because of the medicine (anesthesia) used during the test).   Do not sign any important legal documents or operate any machinery for 24 hours (because of the anesthesia used during the test).  NUTRITION Drink plenty of fluids.  You may resume your normal diet as instructed by your doctor.  Begin with a light meal and progress to your normal diet. Heavy or fried foods are harder to digest and may make you feel sick to your stomach (nauseated).  Avoid alcoholic beverages for 24 hours or as instructed.  MEDICATIONS You may resume your normal medications unless your doctor tells you otherwise.  WHAT YOU CAN EXPECT TODAY Some feelings of bloating in the abdomen.  Passage of more gas than usual.  Spotting of blood in your stool or on the toilet paper.  IF YOU HAD POLYPS REMOVED DURING THE COLONOSCOPY: No aspirin products for 7 days or as instructed.  No alcohol for 7 days or as instructed.  Eat a soft diet for the next 24 hours.  FINDING OUT THE RESULTS OF YOUR TEST Not all test results  are available during your visit. If your test results are not back during the visit, make an appointment with your caregiver to find out the results. Do not assume everything is normal if you have not heard from your caregiver or the medical facility. It is important for you to follow up on all of your test results.  SEEK IMMEDIATE MEDICAL ATTENTION IF: You have more than a  spotting of blood in your stool.  Your belly is swollen (abdominal distention).  You are nauseated or vomiting.  You have a temperature over 101.  You have abdominal pain or discomfort that is severe or gets worse throughout the day.   Your EGD revealed moderate amount inflammation in your stomach.  I took biopsies of this to rule out infection with a bacteria called H. pylori.  Small hiatal hernia noted.  Small bowel appeared normal.  Your esophagus was wide open, I did not need to stretch it today.  Continue on pantoprazole twice daily.  Limit NSAID use.  Your colonoscopy revealed 1 polyp(s) which I removed successfully. Await pathology results, my office will contact you. I recommend repeating colonoscopy in 3 years for surveillance purposes.   Follow-up in GI office in 6 weeks.  We will likely need to perform further studies in regards to your difficulty swallowing.  I hope you have a great rest of your week!  Hennie Duos. Marletta Lor, D.O. Gastroenterology and Hepatology [facility]

## 2023-08-05 NOTE — Anesthesia Postprocedure Evaluation (Signed)
Anesthesia Post Note  Patient: Kristin Crosby  Procedure(s) Performed: COLONOSCOPY WITH PROPOFOL ESOPHAGOGASTRODUODENOSCOPY (EGD) WITH PROPOFOL BIOPSY POLYPECTOMY  Patient location during evaluation: Phase II Anesthesia Type: General Level of consciousness: awake Pain management: pain level controlled Vital Signs Assessment: post-procedure vital signs reviewed and stable Respiratory status: spontaneous breathing and respiratory function stable Cardiovascular status: blood pressure returned to baseline and stable Postop Assessment: no headache and no apparent nausea or vomiting Anesthetic complications: no Comments: Late entry   No notable events documented.   Last Vitals:  Vitals:   08/05/23 0901 08/05/23 1121  BP: (!) 97/54 (!) 96/36  Pulse: 80 76  Resp: 15 16  Temp: 36.9 C 36.6 C  SpO2: 99% 100%    Last Pain:  Vitals:   08/05/23 1231  TempSrc:   PainSc: 0-No pain                 Windell Norfolk

## 2023-08-05 NOTE — Op Note (Signed)
[facility] Patient Name: Kristin Crosby Procedure Date: 08/05/2023 10:30 AM MRN: [id number] Date of Birth: [birth year] Attending MD: Hennie Duos. Marletta Lor , [place], [id number] CSN: [id number] Age: 72 Admit Type: Outpatient Procedure:                Colonoscopy Indications:              High risk colon cancer surveillance: Personal                            history of colonic polyps Providers:                Hennie Duos. Marletta Lor, DO, Sheran Fava, Emilee                            Tubb RN, RN, Dyann Ruddle Referring MD:              Medicines:                See the Anesthesia note for documentation of the                            administered medications Complications:            No immediate complications. Estimated Blood Loss:     Estimated blood loss was minimal. Procedure:                Pre-Anesthesia Assessment:                           - The anesthesia plan was to use monitored                            anesthesia care (MAC).                           After obtaining informed consent, the colonoscope                            was passed under direct vision. Throughout the                            procedure, the patient's blood pressure, pulse, and                            oxygen saturations were monitored continuously. The                            [id number]-HQ190L ([id number]) scope was introduced through                            the anus and advanced to the the cecum, identified                            by appendiceal orifice and ileocecal valve. The                            patient tolerated the procedure  well. The quality                            of the bowel preparation was evaluated using the                            BBPS [facility] Bowel Preparation Scale) with scores                            of: Right Colon = 3, Transverse Colon = 3 and Left                            Colon = 3 (entire mucosa seen well with no residual                            staining,  small fragments of stool or opaque                            liquid). The total BBPS score equals 9. The                            colonoscopy was technically difficult and complex                            due to significant looping and a tortuous colon.                            Successful completion of the procedure was aided by                            applying abdominal pressure. Scope In: 10:50:53 AM Scope Out: 11:17:46 AM Scope Withdrawal Time: 0 hours 19 minutes 0 seconds  Total Procedure Duration: 0 hours 26 minutes 53 seconds  Findings:      Non-bleeding internal hemorrhoids were found during endoscopy.      A 5 mm polyp was found in the cecum. The polyp was sessile. The polyp       was removed with a cold snare. Resection and retrieval were complete.      The exam was otherwise without abnormality. Impression:               - Non-bleeding internal hemorrhoids.                           - One 5 mm polyp in the cecum, removed with a cold                            snare. Resected and retrieved.                           - The examination was otherwise normal. Moderate Sedation:      Per Anesthesia Care Recommendation:           - Patient has a contact number available for  emergencies. The signs and symptoms of potential                            delayed complications were discussed with the                            patient. Return to normal activities tomorrow.                            Written discharge instructions were provided to the                            patient.                           - Resume previous diet.                           - Continue present medications.                           - Await pathology results.                           - Repeat colonoscopy in 3 years for surveillance.                           - Return to GI clinic in 6 weeks. Procedure Code(s):        --- Professional ---                            [id number], Colonoscopy, flexible; with removal of                            tumor(s), polyp(s), or other lesion(s) by snare                            technique Diagnosis Code(s):        --- Professional ---                           Z86.010, Personal history of colonic polyps                           D12.0, Benign neoplasm of cecum                           K64.8, Other hemorrhoids CPT copyright 2022 American Medical Association. All rights reserved. The codes documented in this report are preliminary and upon coder review may  be revised to meet current compliance requirements. Hennie Duos. Marletta Lor, DO Hennie Duos. Marletta Lor, DO 08/05/2023 11:22:18 AM This report has been signed electronically. Number of Addenda: 0

## 2023-08-05 NOTE — Transfer of Care (Signed)
Immediate Anesthesia Transfer of Care Note  Patient: Kristin Crosby  Procedure(s) Performed: COLONOSCOPY WITH PROPOFOL ESOPHAGOGASTRODUODENOSCOPY (EGD) WITH PROPOFOL BIOPSY POLYPECTOMY  Patient Location: Short Stay  Anesthesia Type:General  Level of Consciousness: drowsy  Airway & Oxygen Therapy: Patient Spontanous Breathing and Patient connected to nasal cannula oxygen  Post-op Assessment: Report given to RN and Post -op Vital signs reviewed and stable  Post vital signs: Reviewed and stable  Last Vitals:  Vitals Value Taken Time  BP 96/36 08/05/23 1121  Temp 36.6 C 08/05/23 1121  Pulse 76 08/05/23 1121  Resp 16 08/05/23 1121  SpO2 100 % 08/05/23 1121    Last Pain:  Vitals:   08/05/23 1121  TempSrc: Axillary  PainSc: 0-No pain         Complications: No notable events documented.

## 2023-08-05 NOTE — Anesthesia Preprocedure Evaluation (Signed)
Anesthesia Evaluation  Patient identified by MRN, date of birth, ID band Patient awake    Reviewed: Allergy & Precautions, H&P , NPO status , Patient's Chart, lab work & pertinent test results, reviewed documented beta blocker date and time   History of Anesthesia Complications (+) history of anesthetic complications  Airway Mallampati: II  TM Distance: >3 FB Neck ROM: full    Dental no notable dental hx.    Pulmonary neg pulmonary ROS, former smoker   Pulmonary exam normal breath sounds clear to auscultation       Cardiovascular Exercise Tolerance: Good +CHF  negative cardio ROS  Rhythm:regular Rate:Normal     Neuro/Psych  PSYCHIATRIC DISORDERS Anxiety Depression     Neuromuscular disease negative neurological ROS  negative psych ROS   GI/Hepatic negative GI ROS, Neg liver ROS,GERD  ,,  Endo/Other  negative endocrine ROSdiabetes    Renal/GU negative Renal ROS  negative genitourinary   Musculoskeletal   Abdominal   Peds  Hematology negative hematology ROS (+) Blood dyscrasia, anemia   Anesthesia Other Findings   Reproductive/Obstetrics negative OB ROS                             Anesthesia Physical Anesthesia Plan  ASA: 3  Anesthesia Plan: General   Post-op Pain Management:    Induction:   PONV Risk Score and Plan: Propofol infusion  Airway Management Planned:   Additional Equipment:   Intra-op Plan:   Post-operative Plan:   Informed Consent: I have reviewed the patients History and Physical, chart, labs and discussed the procedure including the risks, benefits and alternatives for the proposed anesthesia with the patient or authorized representative who has indicated his/her understanding and acceptance.     Dental Advisory Given  Plan Discussed with: CRNA  Anesthesia Plan Comments:        Anesthesia Quick Evaluation
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
## 2023-08-05 NOTE — Op Note (Signed)
[facility] Patient Name: Kristin Crosby Procedure Date: 08/05/2023 10:31 AM MRN: [id number] Date of Birth: [birth year] Attending MD: Hennie Duos. Marletta Lor , [place], [id number] CSN: [id number] Age: 72 Admit Type: Outpatient Procedure:                Upper GI endoscopy Indications:              Dysphagia, Heartburn Providers:                Hennie Duos. Marletta Lor, DO, Sheran Fava, Emilee                            Tubb RN, RN, Dyann Ruddle Referring MD:              Medicines:                See the Anesthesia note for documentation of the                            administered medications Complications:            No immediate complications. Estimated Blood Loss:     Estimated blood loss was minimal. Procedure:                Pre-Anesthesia Assessment:                           - The anesthesia plan was to use monitored                            anesthesia care (MAC).                           After obtaining informed consent, the endoscope was                            passed under direct vision. Throughout the                            procedure, the patient's blood pressure, pulse, and                            oxygen saturations were monitored continuously. The                            [id number]-H190 ([id number]) scope was introduced through the                            mouth, and advanced to the second part of duodenum.                            The upper GI endoscopy was accomplished without                            difficulty. The patient tolerated the procedure                            well. Scope In:  10:42:16 AM Scope Out: 10:46:04 AM Total Procedure Duration: 0 hours 3 minutes 48 seconds  Findings:      A small hiatal hernia was present.      There is no endoscopic evidence of stenosis or stricture in the entire       esophagus.      Patchy moderate inflammation characterized by erosions and erythema was       found in the entire examined stomach. Biopsies were taken  with a cold       forceps for Helicobacter pylori testing.      The duodenal bulb, first portion of the duodenum and second portion of       the duodenum were normal. Impression:               - Small hiatal hernia.                           - Gastritis. Biopsied.                           - Normal duodenal bulb, first portion of the                            duodenum and second portion of the duodenum. Moderate Sedation:      Per Anesthesia Care Recommendation:           - Patient has a contact number available for                            emergencies. The signs and symptoms of potential                            delayed complications were discussed with the                            patient. Return to normal activities tomorrow.                            Written discharge instructions were provided to the                            patient.                           - Resume previous diet.                           - Continue present medications.                           - Await pathology results.                           - Use a proton pump inhibitor PO BID.                           - No ibuprofen, naproxen, or other non-steroidal  anti-inflammatory drugs.                           - Return to GI clinic in 6 weeks. Consider MBSS/BPE Procedure Code(s):        --- Professional ---                           [phone], Esophagogastroduodenoscopy, flexible,                            transoral; with biopsy, single or multiple Diagnosis Code(s):        --- Professional ---                           K44.9, Diaphragmatic hernia without obstruction or                            gangrene                           K29.70, Gastritis, unspecified, without bleeding                           R13.10, Dysphagia, unspecified                           R12, Heartburn CPT copyright 2022 American Medical Association. All rights reserved. The codes documented in this  report are preliminary and upon coder review may  be revised to meet current compliance requirements. Hennie Duos. Marletta Lor, DO Hennie Duos. Marletta Lor, DO 08/05/2023 10:48:15 AM This report has been signed electronically. Number of Addenda: 0

## 2023-08-07 LAB — SURGICAL PATHOLOGY

## 2023-08-09 ENCOUNTER — Encounter (HOSPITAL_COMMUNITY): Payer: Self-pay | Admitting: Internal Medicine

## 2023-08-28 DIAGNOSIS — I480 Paroxysmal atrial fibrillation: Secondary | ICD-10-CM | POA: Diagnosis not present

## 2023-08-28 DIAGNOSIS — E782 Mixed hyperlipidemia: Secondary | ICD-10-CM | POA: Diagnosis not present

## 2023-08-28 DIAGNOSIS — I951 Orthostatic hypotension: Secondary | ICD-10-CM | POA: Diagnosis not present

## 2023-08-28 DIAGNOSIS — I5020 Unspecified systolic (congestive) heart failure: Secondary | ICD-10-CM | POA: Diagnosis not present

## 2023-08-28 DIAGNOSIS — I2510 Atherosclerotic heart disease of native coronary artery without angina pectoris: Secondary | ICD-10-CM | POA: Diagnosis not present

## 2023-09-09 DIAGNOSIS — I110 Hypertensive heart disease with heart failure: Secondary | ICD-10-CM | POA: Diagnosis not present

## 2023-09-09 DIAGNOSIS — I517 Cardiomegaly: Secondary | ICD-10-CM | POA: Diagnosis not present

## 2023-09-09 DIAGNOSIS — I340 Nonrheumatic mitral (valve) insufficiency: Secondary | ICD-10-CM | POA: Diagnosis not present

## 2023-09-09 DIAGNOSIS — I5020 Unspecified systolic (congestive) heart failure: Secondary | ICD-10-CM | POA: Diagnosis not present

## 2023-09-16 DIAGNOSIS — E7800 Pure hypercholesterolemia, unspecified: Secondary | ICD-10-CM | POA: Diagnosis not present

## 2023-09-16 DIAGNOSIS — E1169 Type 2 diabetes mellitus with other specified complication: Secondary | ICD-10-CM | POA: Diagnosis not present

## 2023-09-16 DIAGNOSIS — E7849 Other hyperlipidemia: Secondary | ICD-10-CM | POA: Diagnosis not present

## 2023-09-16 DIAGNOSIS — E1122 Type 2 diabetes mellitus with diabetic chronic kidney disease: Secondary | ICD-10-CM | POA: Diagnosis not present

## 2023-09-16 DIAGNOSIS — E039 Hypothyroidism, unspecified: Secondary | ICD-10-CM | POA: Diagnosis not present

## 2023-09-16 DIAGNOSIS — N1831 Chronic kidney disease, stage 3a: Secondary | ICD-10-CM | POA: Diagnosis not present

## 2023-09-16 DIAGNOSIS — Z1321 Encounter for screening for nutritional disorder: Secondary | ICD-10-CM | POA: Diagnosis not present

## 2023-09-17 ENCOUNTER — Ambulatory Visit (INDEPENDENT_AMBULATORY_CARE_PROVIDER_SITE_OTHER): Payer: PPO | Admitting: Gastroenterology

## 2023-09-17 ENCOUNTER — Encounter: Payer: Self-pay | Admitting: *Deleted

## 2023-09-17 ENCOUNTER — Telehealth: Payer: Self-pay | Admitting: *Deleted

## 2023-09-17 VITALS — BP 128/67 | HR 75 | Temp 97.9°F | Ht 64.0 in | Wt 140.0 lb

## 2023-09-17 DIAGNOSIS — K5900 Constipation, unspecified: Secondary | ICD-10-CM | POA: Diagnosis not present

## 2023-09-17 DIAGNOSIS — R1312 Dysphagia, oropharyngeal phase: Secondary | ICD-10-CM | POA: Diagnosis not present

## 2023-09-17 DIAGNOSIS — Z860100 Personal history of colon polyps, unspecified: Secondary | ICD-10-CM

## 2023-09-17 DIAGNOSIS — Z860101 Personal history of adenomatous and serrated colon polyps: Secondary | ICD-10-CM | POA: Diagnosis not present

## 2023-09-17 DIAGNOSIS — K219 Gastro-esophageal reflux disease without esophagitis: Secondary | ICD-10-CM

## 2023-09-17 MED ORDER — OMEPRAZOLE 20 MG PO CPDR: 20.0000 mg | DELAYED_RELEASE_CAPSULE | Freq: Two times a day (BID) | ORAL | 3 refills | Status: DC

## 2023-09-17 NOTE — Telephone Encounter (Signed)
Pt informed of appt date, time and location.
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
## 2023-09-17 NOTE — Telephone Encounter (Signed)
[facility]  BPE scheduled for Friday 09/20/23, arrive at 9:45 am to check in at [facility]. Also sent mychart message

## 2023-09-17 NOTE — Patient Instructions (Signed)
 Take supplemental fiber every day and take one capful of Miralax in afternoon/evening if no bowel movement that day.   Stop pantoprazole  and start omeprazole  (Prilosec) twice a day, 30 minutes before breakfast and dinner.  We are arranging a swallow test to see how your esophagus is working.   We will see you in 3 months!   Have a wonderful New Year!  I enjoyed seeing you again today! I value our relationship and want to provide genuine, compassionate, and quality care. You may receive a survey regarding your visit with me, and I welcome your feedback! Thanks so much for taking the time to complete this. I look forward to seeing you again.      Therisa MICAEL Stager, PhD, ANP-BC [facility]

## 2023-09-20 ENCOUNTER — Other Ambulatory Visit (HOSPITAL_COMMUNITY): Payer: PPO

## 2023-09-20 DIAGNOSIS — I4821 Permanent atrial fibrillation: Secondary | ICD-10-CM | POA: Diagnosis not present

## 2023-09-20 DIAGNOSIS — R4582 Worries: Secondary | ICD-10-CM | POA: Diagnosis not present

## 2023-09-20 DIAGNOSIS — J449 Chronic obstructive pulmonary disease, unspecified: Secondary | ICD-10-CM | POA: Diagnosis not present

## 2023-09-20 DIAGNOSIS — I5020 Unspecified systolic (congestive) heart failure: Secondary | ICD-10-CM | POA: Diagnosis not present

## 2023-09-20 DIAGNOSIS — I480 Paroxysmal atrial fibrillation: Secondary | ICD-10-CM | POA: Diagnosis not present

## 2023-09-20 DIAGNOSIS — Z23 Encounter for immunization: Secondary | ICD-10-CM | POA: Diagnosis not present

## 2023-09-20 DIAGNOSIS — I700 Atherosclerosis of aorta: Secondary | ICD-10-CM | POA: Diagnosis not present

## 2023-09-20 DIAGNOSIS — M3500 Sicca syndrome, unspecified: Secondary | ICD-10-CM | POA: Diagnosis not present

## 2023-09-20 DIAGNOSIS — D692 Other nonthrombocytopenic purpura: Secondary | ICD-10-CM | POA: Diagnosis not present

## 2023-09-20 DIAGNOSIS — I951 Orthostatic hypotension: Secondary | ICD-10-CM | POA: Diagnosis not present

## 2023-09-20 DIAGNOSIS — F5105 Insomnia due to other mental disorder: Secondary | ICD-10-CM | POA: Diagnosis not present

## 2023-09-20 DIAGNOSIS — E782 Mixed hyperlipidemia: Secondary | ICD-10-CM | POA: Diagnosis not present

## 2023-09-20 DIAGNOSIS — Z9889 Other specified postprocedural states: Secondary | ICD-10-CM | POA: Diagnosis not present

## 2023-09-20 DIAGNOSIS — Z87891 Personal history of nicotine dependence: Secondary | ICD-10-CM | POA: Diagnosis not present

## 2023-09-20 DIAGNOSIS — F331 Major depressive disorder, recurrent, moderate: Secondary | ICD-10-CM | POA: Diagnosis not present

## 2023-09-20 DIAGNOSIS — E1122 Type 2 diabetes mellitus with diabetic chronic kidney disease: Secondary | ICD-10-CM | POA: Diagnosis not present

## 2023-09-20 DIAGNOSIS — I2510 Atherosclerotic heart disease of native coronary artery without angina pectoris: Secondary | ICD-10-CM | POA: Diagnosis not present

## 2023-09-24 DIAGNOSIS — M3501 Sicca syndrome with keratoconjunctivitis: Secondary | ICD-10-CM | POA: Diagnosis not present

## 2023-09-24 DIAGNOSIS — Z6823 Body mass index (BMI) 23.0-23.9, adult: Secondary | ICD-10-CM | POA: Diagnosis not present

## 2023-09-24 DIAGNOSIS — M8580 Other specified disorders of bone density and structure, unspecified site: Secondary | ICD-10-CM | POA: Diagnosis not present

## 2023-09-24 DIAGNOSIS — M5030 Other cervical disc degeneration, unspecified cervical region: Secondary | ICD-10-CM | POA: Diagnosis not present

## 2023-09-24 DIAGNOSIS — M25511 Pain in right shoulder: Secondary | ICD-10-CM | POA: Diagnosis not present

## 2023-09-24 DIAGNOSIS — Z7952 Long term (current) use of systemic steroids: Secondary | ICD-10-CM | POA: Diagnosis not present

## 2023-09-25 ENCOUNTER — Ambulatory Visit (HOSPITAL_COMMUNITY)
Admission: RE | Admit: 2023-09-25 | Discharge: 2023-09-25 | Disposition: A | Discharge status: Home / Self Care | Payer: PPO | Source: Ambulatory Visit | Attending: Gastroenterology | Admitting: Gastroenterology

## 2023-09-25 DIAGNOSIS — R1312 Dysphagia, oropharyngeal phase: Secondary | ICD-10-CM | POA: Insufficient documentation

## 2023-09-25 DIAGNOSIS — K449 Diaphragmatic hernia without obstruction or gangrene: Secondary | ICD-10-CM | POA: Diagnosis not present

## 2023-10-01 DIAGNOSIS — I5189 Other ill-defined heart diseases: Secondary | ICD-10-CM | POA: Diagnosis not present

## 2023-10-01 DIAGNOSIS — I209 Angina pectoris, unspecified: Secondary | ICD-10-CM | POA: Diagnosis not present

## 2023-10-01 DIAGNOSIS — I2510 Atherosclerotic heart disease of native coronary artery without angina pectoris: Secondary | ICD-10-CM | POA: Diagnosis not present

## 2023-10-29 DIAGNOSIS — I5022 Chronic systolic (congestive) heart failure: Secondary | ICD-10-CM | POA: Diagnosis not present

## 2023-10-31 ENCOUNTER — Encounter: Payer: Self-pay | Admitting: Gastroenterology

## 2023-11-25 DIAGNOSIS — H524 Presbyopia: Secondary | ICD-10-CM | POA: Diagnosis not present

## 2023-11-25 DIAGNOSIS — E113291 Type 2 diabetes mellitus with mild nonproliferative diabetic retinopathy without macular edema, right eye: Secondary | ICD-10-CM | POA: Diagnosis not present

## 2023-11-25 DIAGNOSIS — D3132 Benign neoplasm of left choroid: Secondary | ICD-10-CM | POA: Diagnosis not present

## 2023-11-25 DIAGNOSIS — H2512 Age-related nuclear cataract, left eye: Secondary | ICD-10-CM | POA: Diagnosis not present

## 2023-11-25 DIAGNOSIS — Z79899 Other long term (current) drug therapy: Secondary | ICD-10-CM | POA: Diagnosis not present

## 2023-12-16 DIAGNOSIS — E1165 Type 2 diabetes mellitus with hyperglycemia: Secondary | ICD-10-CM | POA: Diagnosis not present

## 2023-12-16 DIAGNOSIS — E782 Mixed hyperlipidemia: Secondary | ICD-10-CM | POA: Diagnosis not present

## 2023-12-16 DIAGNOSIS — I10 Essential (primary) hypertension: Secondary | ICD-10-CM | POA: Diagnosis not present

## 2023-12-17 ENCOUNTER — Ambulatory Visit (INDEPENDENT_AMBULATORY_CARE_PROVIDER_SITE_OTHER): Admitting: Gastroenterology

## 2023-12-17 ENCOUNTER — Encounter: Payer: Self-pay | Admitting: Gastroenterology

## 2023-12-17 VITALS — BP 102/59 | HR 78 | Temp 97.7°F | Ht 64.5 in | Wt 140.6 lb

## 2023-12-17 DIAGNOSIS — R1312 Dysphagia, oropharyngeal phase: Secondary | ICD-10-CM

## 2023-12-17 DIAGNOSIS — K219 Gastro-esophageal reflux disease without esophagitis: Secondary | ICD-10-CM

## 2023-12-17 DIAGNOSIS — Z860100 Personal history of colon polyps, unspecified: Secondary | ICD-10-CM

## 2023-12-17 NOTE — Patient Instructions (Signed)
 Let's decrease omeprazole to just once a day, 30 minutes before breakfast. If you find you have worsening reflux with this, you can increase back to twice a day.   Please let me know if any worsening swallowing or if you would like to see speech therapy.  We will see you back in 1 year or sooner if needed!  I enjoyed seeing you again today! I value our relationship and want to provide genuine, compassionate, and quality care. You may receive a survey regarding your visit with me, and I welcome your feedback! Thanks so much for taking the time to complete this. I look forward to seeing you again.      Gelene Mink, PhD, ANP-BC [facility]astroenter[facility]
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
## 2024-01-14 DIAGNOSIS — I10 Essential (primary) hypertension: Secondary | ICD-10-CM | POA: Diagnosis not present

## 2024-01-14 DIAGNOSIS — E782 Mixed hyperlipidemia: Secondary | ICD-10-CM | POA: Diagnosis not present

## 2024-01-14 DIAGNOSIS — E1165 Type 2 diabetes mellitus with hyperglycemia: Secondary | ICD-10-CM | POA: Diagnosis not present

## 2024-01-15 DIAGNOSIS — Z1329 Encounter for screening for other suspected endocrine disorder: Secondary | ICD-10-CM | POA: Diagnosis not present

## 2024-01-15 DIAGNOSIS — D559 Anemia due to enzyme disorder, unspecified: Secondary | ICD-10-CM | POA: Diagnosis not present

## 2024-01-15 DIAGNOSIS — E7849 Other hyperlipidemia: Secondary | ICD-10-CM | POA: Diagnosis not present

## 2024-01-15 DIAGNOSIS — E1165 Type 2 diabetes mellitus with hyperglycemia: Secondary | ICD-10-CM | POA: Diagnosis not present

## 2024-01-15 DIAGNOSIS — I10 Essential (primary) hypertension: Secondary | ICD-10-CM | POA: Diagnosis not present

## 2024-01-15 DIAGNOSIS — E782 Mixed hyperlipidemia: Secondary | ICD-10-CM | POA: Diagnosis not present

## 2024-01-22 DIAGNOSIS — Z23 Encounter for immunization: Secondary | ICD-10-CM | POA: Diagnosis not present

## 2024-01-22 DIAGNOSIS — Z6824 Body mass index (BMI) 24.0-24.9, adult: Secondary | ICD-10-CM | POA: Diagnosis not present

## 2024-01-22 DIAGNOSIS — E782 Mixed hyperlipidemia: Secondary | ICD-10-CM | POA: Diagnosis not present

## 2024-01-22 DIAGNOSIS — I10 Essential (primary) hypertension: Secondary | ICD-10-CM | POA: Diagnosis not present

## 2024-01-22 DIAGNOSIS — Z1331 Encounter for screening for depression: Secondary | ICD-10-CM | POA: Diagnosis not present

## 2024-01-22 DIAGNOSIS — Z0001 Encounter for general adult medical examination with abnormal findings: Secondary | ICD-10-CM | POA: Diagnosis not present

## 2024-01-22 DIAGNOSIS — Z1389 Encounter for screening for other disorder: Secondary | ICD-10-CM | POA: Diagnosis not present

## 2024-01-22 DIAGNOSIS — M3501 Sicca syndrome with keratoconjunctivitis: Secondary | ICD-10-CM | POA: Diagnosis not present

## 2024-01-22 DIAGNOSIS — E1165 Type 2 diabetes mellitus with hyperglycemia: Secondary | ICD-10-CM | POA: Diagnosis not present

## 2024-01-22 DIAGNOSIS — E7849 Other hyperlipidemia: Secondary | ICD-10-CM | POA: Diagnosis not present

## 2024-01-27 DIAGNOSIS — Z1231 Encounter for screening mammogram for malignant neoplasm of breast: Secondary | ICD-10-CM | POA: Diagnosis not present

## 2024-01-29 DIAGNOSIS — I480 Paroxysmal atrial fibrillation: Secondary | ICD-10-CM | POA: Diagnosis not present

## 2024-01-29 DIAGNOSIS — Z87891 Personal history of nicotine dependence: Secondary | ICD-10-CM | POA: Diagnosis not present

## 2024-01-29 DIAGNOSIS — I5020 Unspecified systolic (congestive) heart failure: Secondary | ICD-10-CM | POA: Diagnosis not present

## 2024-01-29 DIAGNOSIS — I951 Orthostatic hypotension: Secondary | ICD-10-CM | POA: Diagnosis not present

## 2024-01-29 DIAGNOSIS — I2510 Atherosclerotic heart disease of native coronary artery without angina pectoris: Secondary | ICD-10-CM | POA: Diagnosis not present

## 2024-01-29 DIAGNOSIS — E782 Mixed hyperlipidemia: Secondary | ICD-10-CM | POA: Diagnosis not present

## 2024-02-11 DIAGNOSIS — J019 Acute sinusitis, unspecified: Secondary | ICD-10-CM | POA: Diagnosis not present

## 2024-02-11 DIAGNOSIS — Z6824 Body mass index (BMI) 24.0-24.9, adult: Secondary | ICD-10-CM | POA: Diagnosis not present

## 2024-02-11 DIAGNOSIS — J40 Bronchitis, not specified as acute or chronic: Secondary | ICD-10-CM | POA: Diagnosis not present

## 2024-02-13 DIAGNOSIS — M75101 Unspecified rotator cuff tear or rupture of right shoulder, not specified as traumatic: Secondary | ICD-10-CM | POA: Diagnosis not present

## 2024-02-13 DIAGNOSIS — M25511 Pain in right shoulder: Secondary | ICD-10-CM | POA: Diagnosis not present

## 2024-02-13 DIAGNOSIS — M12811 Other specific arthropathies, not elsewhere classified, right shoulder: Secondary | ICD-10-CM | POA: Diagnosis not present

## 2024-02-14 DIAGNOSIS — I10 Essential (primary) hypertension: Secondary | ICD-10-CM | POA: Diagnosis not present

## 2024-02-14 DIAGNOSIS — E782 Mixed hyperlipidemia: Secondary | ICD-10-CM | POA: Diagnosis not present

## 2024-02-14 DIAGNOSIS — E1165 Type 2 diabetes mellitus with hyperglycemia: Secondary | ICD-10-CM | POA: Diagnosis not present

## 2024-02-25 DIAGNOSIS — C8590 Non-Hodgkin lymphoma, unspecified, unspecified site: Secondary | ICD-10-CM | POA: Diagnosis not present

## 2024-02-25 DIAGNOSIS — Z1231 Encounter for screening mammogram for malignant neoplasm of breast: Secondary | ICD-10-CM | POA: Diagnosis not present

## 2024-02-27 DIAGNOSIS — M19011 Primary osteoarthritis, right shoulder: Secondary | ICD-10-CM | POA: Diagnosis not present

## 2024-02-27 DIAGNOSIS — M12811 Other specific arthropathies, not elsewhere classified, right shoulder: Secondary | ICD-10-CM | POA: Diagnosis not present

## 2024-02-27 DIAGNOSIS — M11211 Other chondrocalcinosis, right shoulder: Secondary | ICD-10-CM | POA: Diagnosis not present

## 2024-02-27 DIAGNOSIS — Z01818 Encounter for other preprocedural examination: Secondary | ICD-10-CM | POA: Diagnosis not present

## 2024-02-27 DIAGNOSIS — M75101 Unspecified rotator cuff tear or rupture of right shoulder, not specified as traumatic: Secondary | ICD-10-CM | POA: Diagnosis not present

## 2024-03-16 DIAGNOSIS — E1165 Type 2 diabetes mellitus with hyperglycemia: Secondary | ICD-10-CM | POA: Diagnosis not present

## 2024-03-16 DIAGNOSIS — I10 Essential (primary) hypertension: Secondary | ICD-10-CM | POA: Diagnosis not present

## 2024-03-16 DIAGNOSIS — E782 Mixed hyperlipidemia: Secondary | ICD-10-CM | POA: Diagnosis not present

## 2024-03-17 DIAGNOSIS — M8580 Other specified disorders of bone density and structure, unspecified site: Secondary | ICD-10-CM | POA: Diagnosis not present

## 2024-03-17 DIAGNOSIS — M3501 Sicca syndrome with keratoconjunctivitis: Secondary | ICD-10-CM | POA: Diagnosis not present

## 2024-03-17 DIAGNOSIS — M25511 Pain in right shoulder: Secondary | ICD-10-CM | POA: Diagnosis not present

## 2024-03-17 DIAGNOSIS — Z6823 Body mass index (BMI) 23.0-23.9, adult: Secondary | ICD-10-CM | POA: Diagnosis not present

## 2024-03-17 DIAGNOSIS — M5030 Other cervical disc degeneration, unspecified cervical region: Secondary | ICD-10-CM | POA: Diagnosis not present

## 2024-03-17 DIAGNOSIS — Z7952 Long term (current) use of systemic steroids: Secondary | ICD-10-CM | POA: Diagnosis not present

## 2024-04-02 DIAGNOSIS — C8590 Non-Hodgkin lymphoma, unspecified, unspecified site: Secondary | ICD-10-CM | POA: Diagnosis not present

## 2024-04-02 DIAGNOSIS — I5020 Unspecified systolic (congestive) heart failure: Secondary | ICD-10-CM | POA: Diagnosis not present

## 2024-04-02 DIAGNOSIS — F99 Mental disorder, not otherwise specified: Secondary | ICD-10-CM | POA: Diagnosis not present

## 2024-04-02 DIAGNOSIS — F5105 Insomnia due to other mental disorder: Secondary | ICD-10-CM | POA: Diagnosis not present

## 2024-04-02 DIAGNOSIS — K2100 Gastro-esophageal reflux disease with esophagitis, without bleeding: Secondary | ICD-10-CM | POA: Diagnosis not present

## 2024-04-02 DIAGNOSIS — F331 Major depressive disorder, recurrent, moderate: Secondary | ICD-10-CM | POA: Diagnosis not present

## 2024-04-02 DIAGNOSIS — I4821 Permanent atrial fibrillation: Secondary | ICD-10-CM | POA: Diagnosis not present

## 2024-04-02 DIAGNOSIS — E1122 Type 2 diabetes mellitus with diabetic chronic kidney disease: Secondary | ICD-10-CM | POA: Diagnosis not present

## 2024-04-02 DIAGNOSIS — E782 Mixed hyperlipidemia: Secondary | ICD-10-CM | POA: Diagnosis not present

## 2024-04-02 DIAGNOSIS — M3500 Sicca syndrome, unspecified: Secondary | ICD-10-CM | POA: Diagnosis not present

## 2024-04-02 DIAGNOSIS — M12811 Other specific arthropathies, not elsewhere classified, right shoulder: Secondary | ICD-10-CM | POA: Diagnosis not present

## 2024-04-02 DIAGNOSIS — R413 Other amnesia: Secondary | ICD-10-CM | POA: Insufficient documentation

## 2024-04-02 DIAGNOSIS — M75101 Unspecified rotator cuff tear or rupture of right shoulder, not specified as traumatic: Secondary | ICD-10-CM | POA: Diagnosis not present

## 2024-04-16 DIAGNOSIS — I5020 Unspecified systolic (congestive) heart failure: Secondary | ICD-10-CM | POA: Diagnosis not present

## 2024-04-16 DIAGNOSIS — F331 Major depressive disorder, recurrent, moderate: Secondary | ICD-10-CM | POA: Diagnosis not present

## 2024-04-16 DIAGNOSIS — E782 Mixed hyperlipidemia: Secondary | ICD-10-CM | POA: Diagnosis not present

## 2024-04-16 DIAGNOSIS — E1122 Type 2 diabetes mellitus with diabetic chronic kidney disease: Secondary | ICD-10-CM | POA: Diagnosis not present

## 2024-04-17 DIAGNOSIS — M12811 Other specific arthropathies, not elsewhere classified, right shoulder: Secondary | ICD-10-CM | POA: Diagnosis not present

## 2024-04-17 DIAGNOSIS — F99 Mental disorder, not otherwise specified: Secondary | ICD-10-CM | POA: Diagnosis not present

## 2024-04-17 DIAGNOSIS — M75101 Unspecified rotator cuff tear or rupture of right shoulder, not specified as traumatic: Secondary | ICD-10-CM | POA: Diagnosis not present

## 2024-04-17 DIAGNOSIS — M3500 Sicca syndrome, unspecified: Secondary | ICD-10-CM | POA: Diagnosis not present

## 2024-04-17 DIAGNOSIS — F5105 Insomnia due to other mental disorder: Secondary | ICD-10-CM | POA: Diagnosis not present

## 2024-04-17 DIAGNOSIS — E1122 Type 2 diabetes mellitus with diabetic chronic kidney disease: Secondary | ICD-10-CM | POA: Diagnosis not present

## 2024-04-17 DIAGNOSIS — F331 Major depressive disorder, recurrent, moderate: Secondary | ICD-10-CM | POA: Diagnosis not present

## 2024-04-17 DIAGNOSIS — I5020 Unspecified systolic (congestive) heart failure: Secondary | ICD-10-CM | POA: Diagnosis not present

## 2024-04-17 DIAGNOSIS — C8590 Non-Hodgkin lymphoma, unspecified, unspecified site: Secondary | ICD-10-CM | POA: Diagnosis not present

## 2024-04-17 DIAGNOSIS — K2100 Gastro-esophageal reflux disease with esophagitis, without bleeding: Secondary | ICD-10-CM | POA: Diagnosis not present

## 2024-04-17 DIAGNOSIS — E782 Mixed hyperlipidemia: Secondary | ICD-10-CM | POA: Diagnosis not present

## 2024-04-17 DIAGNOSIS — I4821 Permanent atrial fibrillation: Secondary | ICD-10-CM | POA: Diagnosis not present

## 2024-05-04 DIAGNOSIS — Z6824 Body mass index (BMI) 24.0-24.9, adult: Secondary | ICD-10-CM | POA: Diagnosis not present

## 2024-05-04 DIAGNOSIS — D485 Neoplasm of uncertain behavior of skin: Secondary | ICD-10-CM | POA: Diagnosis not present

## 2024-05-15 DIAGNOSIS — F99 Mental disorder, not otherwise specified: Secondary | ICD-10-CM | POA: Diagnosis not present

## 2024-05-15 DIAGNOSIS — M12811 Other specific arthropathies, not elsewhere classified, right shoulder: Secondary | ICD-10-CM | POA: Diagnosis not present

## 2024-05-15 DIAGNOSIS — I4821 Permanent atrial fibrillation: Secondary | ICD-10-CM | POA: Diagnosis not present

## 2024-05-15 DIAGNOSIS — M3500 Sicca syndrome, unspecified: Secondary | ICD-10-CM | POA: Diagnosis not present

## 2024-05-15 DIAGNOSIS — E1122 Type 2 diabetes mellitus with diabetic chronic kidney disease: Secondary | ICD-10-CM | POA: Diagnosis not present

## 2024-05-15 DIAGNOSIS — I5020 Unspecified systolic (congestive) heart failure: Secondary | ICD-10-CM | POA: Diagnosis not present

## 2024-05-15 DIAGNOSIS — F5105 Insomnia due to other mental disorder: Secondary | ICD-10-CM | POA: Diagnosis not present

## 2024-05-15 DIAGNOSIS — E782 Mixed hyperlipidemia: Secondary | ICD-10-CM | POA: Diagnosis not present

## 2024-05-15 DIAGNOSIS — M75101 Unspecified rotator cuff tear or rupture of right shoulder, not specified as traumatic: Secondary | ICD-10-CM | POA: Diagnosis not present

## 2024-05-15 DIAGNOSIS — F331 Major depressive disorder, recurrent, moderate: Secondary | ICD-10-CM | POA: Diagnosis not present

## 2024-05-15 DIAGNOSIS — C8590 Non-Hodgkin lymphoma, unspecified, unspecified site: Secondary | ICD-10-CM | POA: Diagnosis not present

## 2024-05-15 DIAGNOSIS — K2100 Gastro-esophageal reflux disease with esophagitis, without bleeding: Secondary | ICD-10-CM | POA: Diagnosis not present

## 2024-05-19 DIAGNOSIS — D559 Anemia due to enzyme disorder, unspecified: Secondary | ICD-10-CM | POA: Diagnosis not present

## 2024-05-19 DIAGNOSIS — E1165 Type 2 diabetes mellitus with hyperglycemia: Secondary | ICD-10-CM | POA: Diagnosis not present

## 2024-05-19 DIAGNOSIS — N1831 Chronic kidney disease, stage 3a: Secondary | ICD-10-CM | POA: Diagnosis not present

## 2024-05-19 DIAGNOSIS — E039 Hypothyroidism, unspecified: Secondary | ICD-10-CM | POA: Diagnosis not present

## 2024-05-19 DIAGNOSIS — E7849 Other hyperlipidemia: Secondary | ICD-10-CM | POA: Diagnosis not present

## 2024-05-25 DIAGNOSIS — E1165 Type 2 diabetes mellitus with hyperglycemia: Secondary | ICD-10-CM | POA: Diagnosis not present

## 2024-05-25 DIAGNOSIS — F331 Major depressive disorder, recurrent, moderate: Secondary | ICD-10-CM | POA: Diagnosis not present

## 2024-05-25 DIAGNOSIS — M3501 Sicca syndrome with keratoconjunctivitis: Secondary | ICD-10-CM | POA: Diagnosis not present

## 2024-05-25 DIAGNOSIS — E782 Mixed hyperlipidemia: Secondary | ICD-10-CM | POA: Diagnosis not present

## 2024-05-25 DIAGNOSIS — Z6824 Body mass index (BMI) 24.0-24.9, adult: Secondary | ICD-10-CM | POA: Diagnosis not present

## 2024-05-25 DIAGNOSIS — Z23 Encounter for immunization: Secondary | ICD-10-CM | POA: Diagnosis not present

## 2024-05-25 DIAGNOSIS — E7849 Other hyperlipidemia: Secondary | ICD-10-CM | POA: Diagnosis not present

## 2024-05-28 DIAGNOSIS — Z79899 Other long term (current) drug therapy: Secondary | ICD-10-CM | POA: Diagnosis not present

## 2024-05-28 DIAGNOSIS — D3132 Benign neoplasm of left choroid: Secondary | ICD-10-CM | POA: Diagnosis not present

## 2024-06-02 DIAGNOSIS — I4821 Permanent atrial fibrillation: Secondary | ICD-10-CM | POA: Diagnosis not present

## 2024-06-02 DIAGNOSIS — K2100 Gastro-esophageal reflux disease with esophagitis, without bleeding: Secondary | ICD-10-CM | POA: Diagnosis not present

## 2024-06-02 DIAGNOSIS — M75101 Unspecified rotator cuff tear or rupture of right shoulder, not specified as traumatic: Secondary | ICD-10-CM | POA: Diagnosis not present

## 2024-06-02 DIAGNOSIS — E1122 Type 2 diabetes mellitus with diabetic chronic kidney disease: Secondary | ICD-10-CM | POA: Diagnosis not present

## 2024-06-02 DIAGNOSIS — M12811 Other specific arthropathies, not elsewhere classified, right shoulder: Secondary | ICD-10-CM | POA: Diagnosis not present

## 2024-06-02 DIAGNOSIS — F5105 Insomnia due to other mental disorder: Secondary | ICD-10-CM | POA: Diagnosis not present

## 2024-06-02 DIAGNOSIS — M3500 Sicca syndrome, unspecified: Secondary | ICD-10-CM | POA: Diagnosis not present

## 2024-06-02 DIAGNOSIS — F99 Mental disorder, not otherwise specified: Secondary | ICD-10-CM | POA: Diagnosis not present

## 2024-06-02 DIAGNOSIS — I5020 Unspecified systolic (congestive) heart failure: Secondary | ICD-10-CM | POA: Diagnosis not present

## 2024-06-02 DIAGNOSIS — E782 Mixed hyperlipidemia: Secondary | ICD-10-CM | POA: Diagnosis not present

## 2024-06-02 DIAGNOSIS — C8590 Non-Hodgkin lymphoma, unspecified, unspecified site: Secondary | ICD-10-CM | POA: Diagnosis not present

## 2024-06-02 DIAGNOSIS — F331 Major depressive disorder, recurrent, moderate: Secondary | ICD-10-CM | POA: Diagnosis not present

## 2024-06-08 ENCOUNTER — Other Ambulatory Visit: Payer: Self-pay | Admitting: Internal Medicine

## 2024-06-11 DIAGNOSIS — M12811 Other specific arthropathies, not elsewhere classified, right shoulder: Secondary | ICD-10-CM | POA: Diagnosis not present

## 2024-06-11 DIAGNOSIS — M25511 Pain in right shoulder: Secondary | ICD-10-CM | POA: Diagnosis not present

## 2024-06-11 DIAGNOSIS — M75101 Unspecified rotator cuff tear or rupture of right shoulder, not specified as traumatic: Secondary | ICD-10-CM | POA: Diagnosis not present

## 2024-06-15 DIAGNOSIS — E0822 Diabetes mellitus due to underlying condition with diabetic chronic kidney disease: Secondary | ICD-10-CM | POA: Diagnosis not present

## 2024-06-15 DIAGNOSIS — N182 Chronic kidney disease, stage 2 (mild): Secondary | ICD-10-CM | POA: Diagnosis not present

## 2024-06-15 DIAGNOSIS — M2559 Pain in other specified joint: Secondary | ICD-10-CM | POA: Diagnosis not present

## 2024-06-15 DIAGNOSIS — Z01818 Encounter for other preprocedural examination: Secondary | ICD-10-CM | POA: Diagnosis not present

## 2024-06-16 DIAGNOSIS — E782 Mixed hyperlipidemia: Secondary | ICD-10-CM | POA: Diagnosis not present

## 2024-06-16 DIAGNOSIS — E039 Hypothyroidism, unspecified: Secondary | ICD-10-CM | POA: Diagnosis not present

## 2024-06-16 DIAGNOSIS — E1122 Type 2 diabetes mellitus with diabetic chronic kidney disease: Secondary | ICD-10-CM | POA: Diagnosis not present

## 2024-06-16 DIAGNOSIS — I5020 Unspecified systolic (congestive) heart failure: Secondary | ICD-10-CM | POA: Diagnosis not present

## 2024-06-17 DIAGNOSIS — Z01818 Encounter for other preprocedural examination: Secondary | ICD-10-CM | POA: Diagnosis not present

## 2024-06-17 DIAGNOSIS — M75101 Unspecified rotator cuff tear or rupture of right shoulder, not specified as traumatic: Secondary | ICD-10-CM | POA: Diagnosis not present

## 2024-07-01 DIAGNOSIS — Z79899 Other long term (current) drug therapy: Secondary | ICD-10-CM | POA: Diagnosis not present

## 2024-07-01 DIAGNOSIS — I2510 Atherosclerotic heart disease of native coronary artery without angina pectoris: Secondary | ICD-10-CM | POA: Diagnosis not present

## 2024-07-01 DIAGNOSIS — N1831 Chronic kidney disease, stage 3a: Secondary | ICD-10-CM | POA: Diagnosis not present

## 2024-07-01 DIAGNOSIS — J439 Emphysema, unspecified: Secondary | ICD-10-CM | POA: Diagnosis not present

## 2024-07-01 DIAGNOSIS — I480 Paroxysmal atrial fibrillation: Secondary | ICD-10-CM | POA: Diagnosis not present

## 2024-07-01 DIAGNOSIS — M1611 Unilateral primary osteoarthritis, right hip: Secondary | ICD-10-CM | POA: Diagnosis not present

## 2024-07-01 DIAGNOSIS — E1122 Type 2 diabetes mellitus with diabetic chronic kidney disease: Secondary | ICD-10-CM | POA: Diagnosis not present

## 2024-07-01 DIAGNOSIS — M6281 Muscle weakness (generalized): Secondary | ICD-10-CM | POA: Diagnosis not present

## 2024-07-01 DIAGNOSIS — I440 Atrioventricular block, first degree: Secondary | ICD-10-CM | POA: Diagnosis not present

## 2024-07-01 DIAGNOSIS — R54 Age-related physical debility: Secondary | ICD-10-CM | POA: Diagnosis not present

## 2024-07-01 DIAGNOSIS — I9589 Other hypotension: Secondary | ICD-10-CM | POA: Diagnosis not present

## 2024-07-01 DIAGNOSIS — I5020 Unspecified systolic (congestive) heart failure: Secondary | ICD-10-CM | POA: Diagnosis not present

## 2024-07-01 DIAGNOSIS — R2681 Unsteadiness on feet: Secondary | ICD-10-CM | POA: Diagnosis not present

## 2024-07-01 DIAGNOSIS — R9431 Abnormal electrocardiogram [ECG] [EKG]: Secondary | ICD-10-CM | POA: Diagnosis not present

## 2024-07-01 DIAGNOSIS — E7800 Pure hypercholesterolemia, unspecified: Secondary | ICD-10-CM | POA: Diagnosis not present

## 2024-07-01 DIAGNOSIS — I5042 Chronic combined systolic (congestive) and diastolic (congestive) heart failure: Secondary | ICD-10-CM | POA: Diagnosis not present

## 2024-07-01 DIAGNOSIS — N189 Chronic kidney disease, unspecified: Secondary | ICD-10-CM | POA: Diagnosis not present

## 2024-07-01 DIAGNOSIS — I071 Rheumatic tricuspid insufficiency: Secondary | ICD-10-CM | POA: Diagnosis not present

## 2024-07-01 DIAGNOSIS — M12811 Other specific arthropathies, not elsewhere classified, right shoulder: Secondary | ICD-10-CM | POA: Diagnosis not present

## 2024-07-01 DIAGNOSIS — I371 Nonrheumatic pulmonary valve insufficiency: Secondary | ICD-10-CM | POA: Diagnosis not present

## 2024-07-01 DIAGNOSIS — Z8572 Personal history of non-Hodgkin lymphomas: Secondary | ICD-10-CM | POA: Diagnosis not present

## 2024-07-01 DIAGNOSIS — I5022 Chronic systolic (congestive) heart failure: Secondary | ICD-10-CM | POA: Diagnosis not present

## 2024-07-01 DIAGNOSIS — F419 Anxiety disorder, unspecified: Secondary | ICD-10-CM | POA: Diagnosis not present

## 2024-07-01 DIAGNOSIS — I959 Hypotension, unspecified: Secondary | ICD-10-CM | POA: Diagnosis not present

## 2024-07-01 DIAGNOSIS — M19011 Primary osteoarthritis, right shoulder: Secondary | ICD-10-CM | POA: Diagnosis not present

## 2024-07-01 DIAGNOSIS — F319 Bipolar disorder, unspecified: Secondary | ICD-10-CM | POA: Diagnosis not present

## 2024-07-01 DIAGNOSIS — Z794 Long term (current) use of insulin: Secondary | ICD-10-CM | POA: Diagnosis not present

## 2024-07-01 DIAGNOSIS — Z96611 Presence of right artificial shoulder joint: Secondary | ICD-10-CM | POA: Diagnosis not present

## 2024-07-01 DIAGNOSIS — K219 Gastro-esophageal reflux disease without esophagitis: Secondary | ICD-10-CM | POA: Diagnosis not present

## 2024-07-01 DIAGNOSIS — Z7901 Long term (current) use of anticoagulants: Secondary | ICD-10-CM | POA: Diagnosis not present

## 2024-07-01 DIAGNOSIS — I10 Essential (primary) hypertension: Secondary | ICD-10-CM | POA: Diagnosis not present

## 2024-07-01 DIAGNOSIS — M75121 Complete rotator cuff tear or rupture of right shoulder, not specified as traumatic: Secondary | ICD-10-CM | POA: Diagnosis not present

## 2024-07-01 DIAGNOSIS — Z7984 Long term (current) use of oral hypoglycemic drugs: Secondary | ICD-10-CM | POA: Diagnosis not present

## 2024-07-01 DIAGNOSIS — N179 Acute kidney failure, unspecified: Secondary | ICD-10-CM | POA: Diagnosis not present

## 2024-07-01 DIAGNOSIS — I493 Ventricular premature depolarization: Secondary | ICD-10-CM | POA: Diagnosis not present

## 2024-07-02 DIAGNOSIS — F0630 Mood disorder due to known physiological condition, unspecified: Secondary | ICD-10-CM | POA: Diagnosis not present

## 2024-07-02 DIAGNOSIS — M75121 Complete rotator cuff tear or rupture of right shoulder, not specified as traumatic: Secondary | ICD-10-CM | POA: Diagnosis not present

## 2024-07-02 DIAGNOSIS — Z9889 Other specified postprocedural states: Secondary | ICD-10-CM | POA: Diagnosis not present

## 2024-07-02 DIAGNOSIS — I2510 Atherosclerotic heart disease of native coronary artery without angina pectoris: Secondary | ICD-10-CM | POA: Diagnosis not present

## 2024-07-02 DIAGNOSIS — I959 Hypotension, unspecified: Secondary | ICD-10-CM | POA: Diagnosis not present

## 2024-07-02 DIAGNOSIS — I480 Paroxysmal atrial fibrillation: Secondary | ICD-10-CM | POA: Diagnosis not present

## 2024-07-02 DIAGNOSIS — Z79899 Other long term (current) drug therapy: Secondary | ICD-10-CM | POA: Diagnosis not present

## 2024-07-02 DIAGNOSIS — E119 Type 2 diabetes mellitus without complications: Secondary | ICD-10-CM | POA: Diagnosis not present

## 2024-07-02 DIAGNOSIS — I5022 Chronic systolic (congestive) heart failure: Secondary | ICD-10-CM | POA: Diagnosis not present

## 2024-07-02 DIAGNOSIS — Z7901 Long term (current) use of anticoagulants: Secondary | ICD-10-CM | POA: Diagnosis not present

## 2024-07-02 DIAGNOSIS — I5032 Chronic diastolic (congestive) heart failure: Secondary | ICD-10-CM | POA: Diagnosis not present

## 2024-07-02 DIAGNOSIS — C8841 Extranodal marginal zone b-cell lymphoma of mucosa-associated lymphoid tissue (malt-lymphoma), in remission: Secondary | ICD-10-CM | POA: Diagnosis not present

## 2024-07-03 DIAGNOSIS — M75121 Complete rotator cuff tear or rupture of right shoulder, not specified as traumatic: Secondary | ICD-10-CM | POA: Diagnosis not present

## 2024-07-03 DIAGNOSIS — M12811 Other specific arthropathies, not elsewhere classified, right shoulder: Secondary | ICD-10-CM | POA: Diagnosis not present

## 2024-07-10 DIAGNOSIS — E782 Mixed hyperlipidemia: Secondary | ICD-10-CM | POA: Diagnosis not present

## 2024-07-10 DIAGNOSIS — I5020 Unspecified systolic (congestive) heart failure: Secondary | ICD-10-CM | POA: Diagnosis not present

## 2024-07-10 DIAGNOSIS — E1122 Type 2 diabetes mellitus with diabetic chronic kidney disease: Secondary | ICD-10-CM | POA: Diagnosis not present

## 2024-07-10 DIAGNOSIS — F5105 Insomnia due to other mental disorder: Secondary | ICD-10-CM | POA: Diagnosis not present

## 2024-07-10 DIAGNOSIS — K2100 Gastro-esophageal reflux disease with esophagitis, without bleeding: Secondary | ICD-10-CM | POA: Diagnosis not present

## 2024-07-10 DIAGNOSIS — F99 Mental disorder, not otherwise specified: Secondary | ICD-10-CM | POA: Diagnosis not present

## 2024-07-10 DIAGNOSIS — M3500 Sicca syndrome, unspecified: Secondary | ICD-10-CM | POA: Diagnosis not present

## 2024-07-10 DIAGNOSIS — C8590 Non-Hodgkin lymphoma, unspecified, unspecified site: Secondary | ICD-10-CM | POA: Diagnosis not present

## 2024-07-10 DIAGNOSIS — I4821 Permanent atrial fibrillation: Secondary | ICD-10-CM | POA: Diagnosis not present

## 2024-07-10 DIAGNOSIS — F331 Major depressive disorder, recurrent, moderate: Secondary | ICD-10-CM | POA: Diagnosis not present

## 2024-07-10 DIAGNOSIS — M12811 Other specific arthropathies, not elsewhere classified, right shoulder: Secondary | ICD-10-CM | POA: Diagnosis not present

## 2024-07-10 DIAGNOSIS — M75101 Unspecified rotator cuff tear or rupture of right shoulder, not specified as traumatic: Secondary | ICD-10-CM | POA: Diagnosis not present

## 2024-07-16 DIAGNOSIS — Z96611 Presence of right artificial shoulder joint: Secondary | ICD-10-CM | POA: Diagnosis not present

## 2024-07-17 DIAGNOSIS — I4821 Permanent atrial fibrillation: Secondary | ICD-10-CM | POA: Diagnosis not present

## 2024-07-17 DIAGNOSIS — I5020 Unspecified systolic (congestive) heart failure: Secondary | ICD-10-CM | POA: Diagnosis not present

## 2024-07-17 DIAGNOSIS — E782 Mixed hyperlipidemia: Secondary | ICD-10-CM | POA: Diagnosis not present

## 2024-07-17 DIAGNOSIS — F331 Major depressive disorder, recurrent, moderate: Secondary | ICD-10-CM | POA: Diagnosis not present

## 2024-07-24 NOTE — Progress Notes (Signed)
 ANALISSE RANDLE                                          MRN: [id number]   07/24/2024   The [facility] Quality Team Specialist reviewed this patient medical record for the purposes of chart review for care gap closure. The following were reviewed: chart review for care gap closure-kidney health evaluation for diabetes:eGFR  and uACR.    [facility] Quality Team

## 2024-07-27 ENCOUNTER — Encounter: Payer: Self-pay | Admitting: Neurology

## 2024-07-27 ENCOUNTER — Ambulatory Visit: Admitting: Neurology

## 2024-07-27 VITALS — BP 101/66 | HR 88 | Ht 64.5 in | Wt 146.0 lb

## 2024-07-27 DIAGNOSIS — J449 Chronic obstructive pulmonary disease, unspecified: Secondary | ICD-10-CM | POA: Insufficient documentation

## 2024-07-27 DIAGNOSIS — N1831 Chronic kidney disease, stage 3a: Secondary | ICD-10-CM | POA: Insufficient documentation

## 2024-07-27 DIAGNOSIS — F5105 Insomnia due to other mental disorder: Secondary | ICD-10-CM | POA: Insufficient documentation

## 2024-07-27 DIAGNOSIS — G3184 Mild cognitive impairment, so stated: Secondary | ICD-10-CM | POA: Diagnosis not present

## 2024-07-27 DIAGNOSIS — E1122 Type 2 diabetes mellitus with diabetic chronic kidney disease: Secondary | ICD-10-CM | POA: Insufficient documentation

## 2024-07-27 DIAGNOSIS — E782 Mixed hyperlipidemia: Secondary | ICD-10-CM | POA: Insufficient documentation

## 2024-07-27 DIAGNOSIS — E039 Hypothyroidism, unspecified: Secondary | ICD-10-CM | POA: Insufficient documentation

## 2024-07-27 DIAGNOSIS — K2100 Gastro-esophageal reflux disease with esophagitis, without bleeding: Secondary | ICD-10-CM | POA: Insufficient documentation
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
## 2024-07-27 NOTE — Patient Instructions (Addendum)
 Continue current medications Will check Dementia labs including TSH, ATN and B12  Continue to follow up with PCP  Return as needed    There are well-accepted and sensible ways to reduce risk for Alzheimers disease and other degenerative brain disorders .  Exercise Daily Walk A daily 20 minute walk should be part of your routine. Disease related apathy can be a significant roadblock to exercise and the only way to overcome this is to make it a daily routine and perhaps have a reward at the end (something your loved one loves to eat or drink perhaps) or a personal trainer coming to the home can also be very useful. Most importantly, the patient is much more likely to exercise if the caregiver / spouse does it with him/her. In general a structured, repetitive schedule is best.  General Health: Any diseases which effect your body will effect your brain such as a pneumonia, urinary infection, blood clot, heart attack or stroke. Keep contact with your primary care doctor for regular follow ups.  Sleep. A good nights sleep is healthy for the brain. Seven hours is recommended. If you have insomnia or poor sleep habits we can give you some instructions. If you have sleep apnea wear your mask.  Diet: Eating a heart healthy diet is also a good idea; fish and poultry instead of red meat, nuts (mostly non-peanuts), vegetables, fruits, olive oil or canola oil (instead of butter), minimal salt (use other spices to flavor foods), whole grain rice, bread, cereal and pasta and wine in moderation.Research is now showing that the MIND diet, which is a combination of The Mediterranean diet and the DASH diet, is beneficial for cognitive processing and longevity. Information about this diet can be found in The MIND Diet, a book by Annitta Feeling, MS, RDN, and online at wildwildscience.es  Finances, Power of [address] and Advance Directives: You should consider putting legal safeguards in place with  regard to financial and medical decision making. While the spouse always has power of attorney for medical and financial issues in the absence of any form, you should consider what you want in case the spouse / caregiver is no longer around or capable of making decisions.

## 2024-07-29 DIAGNOSIS — Z23 Encounter for immunization: Secondary | ICD-10-CM | POA: Diagnosis not present

## 2024-07-31 ENCOUNTER — Ambulatory Visit: Payer: Self-pay | Admitting: Neurology

## 2024-07-31 LAB — ATN PROFILE
A -- Beta-amyloid 42/40 Ratio: 0.109 (ref >0.102)
Beta-amyloid 40: 354.08 pg/mL
Beta-amyloid 42: 38.76 pg/mL
N -- NfL, Plasma: 6.26 pg/mL — AB (ref 0.00–6.04)
T -- p-tau181: 1.33 pg/mL — AB (ref 0.00–0.97)

## 2024-07-31 LAB — TSH: TSH: 0.853 u[IU]/mL (ref 0.450–4.500)

## 2024-07-31 LAB — VITAMIN B12: Vitamin B-12: 421 pg/mL (ref 232–1245)

## 2024-08-02 DIAGNOSIS — M810 Age-related osteoporosis without current pathological fracture: Secondary | ICD-10-CM | POA: Diagnosis not present

## 2024-08-02 DIAGNOSIS — N1832 Chronic kidney disease, stage 3b: Secondary | ICD-10-CM | POA: Diagnosis not present

## 2024-08-02 DIAGNOSIS — M069 Rheumatoid arthritis, unspecified: Secondary | ICD-10-CM | POA: Diagnosis not present

## 2024-08-02 DIAGNOSIS — M3501 Sicca syndrome with keratoconjunctivitis: Secondary | ICD-10-CM | POA: Diagnosis not present

## 2024-08-02 DIAGNOSIS — K2100 Gastro-esophageal reflux disease with esophagitis, without bleeding: Secondary | ICD-10-CM | POA: Diagnosis not present

## 2024-08-02 DIAGNOSIS — E1165 Type 2 diabetes mellitus with hyperglycemia: Secondary | ICD-10-CM | POA: Diagnosis not present

## 2024-08-02 DIAGNOSIS — I4821 Permanent atrial fibrillation: Secondary | ICD-10-CM | POA: Diagnosis not present

## 2024-08-02 DIAGNOSIS — I10 Essential (primary) hypertension: Secondary | ICD-10-CM | POA: Diagnosis not present

## 2024-08-02 DIAGNOSIS — J441 Chronic obstructive pulmonary disease with (acute) exacerbation: Secondary | ICD-10-CM | POA: Diagnosis not present

## 2024-08-02 DIAGNOSIS — I5020 Unspecified systolic (congestive) heart failure: Secondary | ICD-10-CM | POA: Diagnosis not present

## 2024-08-06 DIAGNOSIS — M3500 Sicca syndrome, unspecified: Secondary | ICD-10-CM | POA: Diagnosis not present

## 2024-08-06 DIAGNOSIS — M75101 Unspecified rotator cuff tear or rupture of right shoulder, not specified as traumatic: Secondary | ICD-10-CM | POA: Diagnosis not present

## 2024-08-06 DIAGNOSIS — F331 Major depressive disorder, recurrent, moderate: Secondary | ICD-10-CM | POA: Diagnosis not present

## 2024-08-06 DIAGNOSIS — C8590 Non-Hodgkin lymphoma, unspecified, unspecified site: Secondary | ICD-10-CM | POA: Diagnosis not present

## 2024-08-06 DIAGNOSIS — I4821 Permanent atrial fibrillation: Secondary | ICD-10-CM | POA: Diagnosis not present

## 2024-08-06 DIAGNOSIS — F99 Mental disorder, not otherwise specified: Secondary | ICD-10-CM | POA: Diagnosis not present

## 2024-08-06 DIAGNOSIS — K2100 Gastro-esophageal reflux disease with esophagitis, without bleeding: Secondary | ICD-10-CM | POA: Diagnosis not present

## 2024-08-06 DIAGNOSIS — E782 Mixed hyperlipidemia: Secondary | ICD-10-CM | POA: Diagnosis not present

## 2024-08-06 DIAGNOSIS — I5020 Unspecified systolic (congestive) heart failure: Secondary | ICD-10-CM | POA: Diagnosis not present

## 2024-08-06 DIAGNOSIS — M12811 Other specific arthropathies, not elsewhere classified, right shoulder: Secondary | ICD-10-CM | POA: Diagnosis not present

## 2024-08-06 DIAGNOSIS — F5105 Insomnia due to other mental disorder: Secondary | ICD-10-CM | POA: Diagnosis not present

## 2024-08-06 DIAGNOSIS — E1122 Type 2 diabetes mellitus with diabetic chronic kidney disease: Secondary | ICD-10-CM | POA: Diagnosis not present

## 2024-08-06 NOTE — Progress Notes (Signed)
 Kristin Crosby                                          MRN: [id number]   08/06/2024   The [facility] Quality Team Specialist reviewed this patient medical record for the purposes of chart review for care gap closure. The following were reviewed: chart review for care gap closure-kidney health evaluation for diabetes:eGFR  and uACR.    [facility] Quality Team

## 2024-08-16 DIAGNOSIS — I5020 Unspecified systolic (congestive) heart failure: Secondary | ICD-10-CM | POA: Diagnosis not present

## 2024-08-16 DIAGNOSIS — E1122 Type 2 diabetes mellitus with diabetic chronic kidney disease: Secondary | ICD-10-CM | POA: Diagnosis not present

## 2024-08-16 DIAGNOSIS — E782 Mixed hyperlipidemia: Secondary | ICD-10-CM | POA: Diagnosis not present

## 2024-08-16 DIAGNOSIS — F331 Major depressive disorder, recurrent, moderate: Secondary | ICD-10-CM | POA: Diagnosis not present

## 2024-08-17 DIAGNOSIS — Z96611 Presence of right artificial shoulder joint: Secondary | ICD-10-CM | POA: Diagnosis not present

## 2024-08-18 DIAGNOSIS — M79675 Pain in left toe(s): Secondary | ICD-10-CM | POA: Diagnosis not present

## 2024-08-18 DIAGNOSIS — M79672 Pain in left foot: Secondary | ICD-10-CM | POA: Diagnosis not present

## 2024-08-18 DIAGNOSIS — E1140 Type 2 diabetes mellitus with diabetic neuropathy, unspecified: Secondary | ICD-10-CM | POA: Diagnosis not present

## 2024-08-18 DIAGNOSIS — M79674 Pain in right toe(s): Secondary | ICD-10-CM | POA: Diagnosis not present

## 2024-08-18 DIAGNOSIS — L110 Acquired keratosis follicularis: Secondary | ICD-10-CM | POA: Diagnosis not present

## 2024-08-18 DIAGNOSIS — I739 Peripheral vascular disease, unspecified: Secondary | ICD-10-CM | POA: Diagnosis not present

## 2024-08-18 DIAGNOSIS — M79671 Pain in right foot: Secondary | ICD-10-CM | POA: Diagnosis not present

## 2024-08-26 DIAGNOSIS — R399 Unspecified symptoms and signs involving the genitourinary system: Secondary | ICD-10-CM | POA: Diagnosis not present

## 2024-10-02 ENCOUNTER — Other Ambulatory Visit: Payer: Self-pay | Admitting: Gastroenterology

## 2024-10-02 NOTE — Telephone Encounter (Signed)
 Pt needs an appt before further refills.

## 2024-10-09 NOTE — Telephone Encounter (Signed)
noed

## 2024-12-08 ENCOUNTER — Ambulatory Visit: Admitting: Gastroenterology
